# Patient Record
Sex: Female | Born: 1960 | Race: White | Hispanic: No | Marital: Married | Smoking: Current every day smoker
Health system: Southern US, Community
[De-identification: ages and names within clinical notes are randomized; demographics above are authoritative.]

## PROBLEM LIST (undated history)

## (undated) DIAGNOSIS — R2689 Other abnormalities of gait and mobility: Secondary | ICD-10-CM

## (undated) DIAGNOSIS — J439 Emphysema, unspecified: Secondary | ICD-10-CM

## (undated) DIAGNOSIS — I499 Cardiac arrhythmia, unspecified: Secondary | ICD-10-CM

## (undated) DIAGNOSIS — I10 Essential (primary) hypertension: Secondary | ICD-10-CM

## (undated) DIAGNOSIS — D126 Benign neoplasm of colon, unspecified: Secondary | ICD-10-CM

## (undated) DIAGNOSIS — R202 Paresthesia of skin: Secondary | ICD-10-CM

## (undated) DIAGNOSIS — F419 Anxiety disorder, unspecified: Secondary | ICD-10-CM

## (undated) DIAGNOSIS — D72829 Elevated white blood cell count, unspecified: Secondary | ICD-10-CM

## (undated) DIAGNOSIS — K5790 Diverticulosis of intestine, part unspecified, without perforation or abscess without bleeding: Secondary | ICD-10-CM

## (undated) DIAGNOSIS — F329 Major depressive disorder, single episode, unspecified: Secondary | ICD-10-CM

## (undated) DIAGNOSIS — E7800 Pure hypercholesterolemia, unspecified: Secondary | ICD-10-CM

## (undated) DIAGNOSIS — I219 Acute myocardial infarction, unspecified: Secondary | ICD-10-CM

## (undated) DIAGNOSIS — K219 Gastro-esophageal reflux disease without esophagitis: Secondary | ICD-10-CM

## (undated) DIAGNOSIS — K2210 Ulcer of esophagus without bleeding: Secondary | ICD-10-CM

## (undated) DIAGNOSIS — G43909 Migraine, unspecified, not intractable, without status migrainosus: Secondary | ICD-10-CM

## (undated) DIAGNOSIS — Z6834 Body mass index (BMI) 34.0-34.9, adult: Secondary | ICD-10-CM

## (undated) DIAGNOSIS — I700 Atherosclerosis of aorta: Secondary | ICD-10-CM

## (undated) HISTORY — DX: Body mass index (BMI) 34.0-34.9, adult: Z68.34

## (undated) HISTORY — DX: Other abnormalities of gait and mobility: R26.89

## (undated) HISTORY — DX: Diverticulosis of intestine, part unspecified, without perforation or abscess without bleeding: K57.90

## (undated) HISTORY — DX: Gastro-esophageal reflux disease without esophagitis: K21.9

## (undated) HISTORY — PX: KNEE ARTHROSCOPY W/ MENISCECTOMY: SHX1879

## (undated) HISTORY — DX: Emphysema, unspecified: J43.9

## (undated) HISTORY — PX: LUMBAR DISC SURGERY: SHX700

## (undated) HISTORY — DX: Elevated white blood cell count, unspecified: D72.829

## (undated) HISTORY — DX: Paresthesia of skin: R20.2

## (undated) HISTORY — DX: Anxiety disorder, unspecified: F41.9

## (undated) HISTORY — DX: Migraine, unspecified, not intractable, without status migrainosus: G43.909

## (undated) HISTORY — PX: UMBILICAL HERNIA REPAIR: SHX196

## (undated) HISTORY — DX: Cardiac arrhythmia, unspecified: I49.9

## (undated) HISTORY — DX: Benign neoplasm of colon, unspecified: D12.6

## (undated) HISTORY — DX: Atherosclerosis of aorta: I70.0

## (undated) HISTORY — DX: Major depressive disorder, single episode, unspecified: F32.9

## (undated) HISTORY — DX: Pure hypercholesterolemia, unspecified: E78.00

## (undated) HISTORY — DX: Essential (primary) hypertension: I10

## (undated) HISTORY — DX: Ulcer of esophagus without bleeding: K22.10

---
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
## 2003-06-02 DIAGNOSIS — I219 Acute myocardial infarction, unspecified: Secondary | ICD-10-CM

## 2003-06-02 HISTORY — PX: APPENDECTOMY: SHX54

## 2003-06-02 HISTORY — DX: Acute myocardial infarction, unspecified: I21.9

## 2011-06-02 HISTORY — PX: COLONOSCOPY: SHX174

## 2012-06-01 HISTORY — PX: GALLBLADDER SURGERY: SHX652

## 2017-08-10 ENCOUNTER — Other Ambulatory Visit: Payer: Self-pay | Admitting: Family

## 2017-08-10 ENCOUNTER — Ambulatory Visit
Admission: RE | Admit: 2017-08-10 | Discharge: 2017-08-10 | Disposition: A | Discharge status: Home / Self Care | Payer: No Typology Code available for payment source | Source: Ambulatory Visit | Attending: Family | Admitting: Family

## 2017-08-10 DIAGNOSIS — R109 Unspecified abdominal pain: Secondary | ICD-10-CM

## 2017-08-10 MED ORDER — IOPAMIDOL (ISOVUE-300) INJECTION 61%
100.0000 mL | Freq: Once | INTRAVENOUS | Status: AC | PRN
  Administered 2017-08-10: 100 mL via INTRAVENOUS

## 2018-12-21 ENCOUNTER — Encounter: Payer: Self-pay | Admitting: Physician Assistant

## 2019-01-10 ENCOUNTER — Other Ambulatory Visit (INDEPENDENT_AMBULATORY_CARE_PROVIDER_SITE_OTHER)

## 2019-01-10 ENCOUNTER — Encounter: Payer: Self-pay | Admitting: Physician Assistant

## 2019-01-10 ENCOUNTER — Encounter (INDEPENDENT_AMBULATORY_CARE_PROVIDER_SITE_OTHER): Payer: Self-pay

## 2019-01-10 ENCOUNTER — Ambulatory Visit (INDEPENDENT_AMBULATORY_CARE_PROVIDER_SITE_OTHER): Admitting: Physician Assistant

## 2019-01-10 VITALS — BP 130/88 | HR 64 | Temp 97.8°F | Ht 64.0 in | Wt 196.2 lb

## 2019-01-10 DIAGNOSIS — R110 Nausea: Secondary | ICD-10-CM

## 2019-01-10 DIAGNOSIS — R1031 Right lower quadrant pain: Secondary | ICD-10-CM | POA: Diagnosis not present

## 2019-01-10 DIAGNOSIS — R6881 Early satiety: Secondary | ICD-10-CM

## 2019-01-10 DIAGNOSIS — K5900 Constipation, unspecified: Secondary | ICD-10-CM

## 2019-01-10 DIAGNOSIS — G8929 Other chronic pain: Secondary | ICD-10-CM

## 2019-01-10 DIAGNOSIS — R079 Chest pain, unspecified: Secondary | ICD-10-CM

## 2019-01-10 LAB — BASIC METABOLIC PANEL
BUN: 8 mg/dL (ref 6–23)
CO2: 25 mEq/L (ref 19–32)
Calcium: 9.8 mg/dL (ref 8.4–10.5)
Chloride: 105 mEq/L (ref 96–112)
Creatinine, Ser: 0.81 mg/dL (ref 0.40–1.20)
GFR: 72.69 mL/min (ref >60.00)
Glucose, Bld: 104 mg/dL — ABNORMAL HIGH (ref 70–99)
Potassium: 4.5 mEq/L (ref 3.5–5.1)
Sodium: 139 mEq/L (ref 135–145)

## 2019-01-10 MED ORDER — PANTOPRAZOLE SODIUM 40 MG PO TBEC: 40.0000 mg | DELAYED_RELEASE_TABLET | Freq: Every day | ORAL | 6 refills | Status: DC

## 2019-01-10 NOTE — Patient Instructions (Signed)
You have been scheduled for a CT scan of the abdomen and pelvis at [facility] ([address]---this is in the same building as [facility]).   You are scheduled on 01/12/19 at 1045. You should arrive 15 minutes prior to your appointment time for registration. Please follow the written instructions below on the day of your exam:  WARNING: IF YOU ARE ALLERGIC TO IODINE/X-RAY DYE, PLEASE NOTIFY RADIOLOGY IMMEDIATELY AT [phone]! YOU WILL BE GIVEN A 13 HOUR PREMEDICATION PREP.  1) Do not eat or drink anything after 645am (4 hours prior to your test) 2) You have been given 2 bottles of oral contrast to drink. The solution may taste better if refrigerated, but do NOT add ice or any other liquid to this solution. Shake well before drinking.    Drink 1 bottle of contrast @ 845am (2 hours prior to your exam)  Drink 1 bottle of contrast @ 945am (1 hour prior to your exam)  You may take any medications as prescribed with a small amount of water, if necessary. If you take any of the following medications: METFORMIN, GLUCOPHAGE, GLUCOVANCE, AVANDAMET, RIOMET, FORTAMET, [place] MET, JANUMET, GLUMETZA or METAGLIP, you MAY be asked to HOLD this medication 48 hours AFTER the exam.  The purpose of you drinking the oral contrast is to aid in the visualization of your intestinal tract. The contrast solution may cause some diarrhea. Depending on your individual set of symptoms, you may also receive an intravenous injection of x-ray contrast/dye. Plan on being at [facility] for 30 minutes or longer, depending on the type of exam you are having performed.  This test typically takes 30-45 minutes to complete.  If you have any questions regarding your exam or if you need to reschedule, you may call the CT department at [phone] between the hours of 8:00 am and 5:00 pm, Monday-Friday.  We have sent the following medications to your pharmacy for you to pick up at your  convenience: Protonix Continue Phenergan 25 mg every 12 hours  Please purchase the following medications over the counter and take as directed: Miralax 1gm  In 8 oz of water daily   Increase water intake  Your provider has requested that you go to the basement level for lab work before leaving today. Press "B" on the elevator. The lab is located at the first door on the left as you exit the elevator.   ________________________________________________________________________

## 2019-01-10 NOTE — Progress Notes (Signed)
Subjective:    Patient ID: Leslie Roman, female    DOB: 1961/06/01, 58 y.o.   MRN: [id number]  HPI Leslie Roman is a pleasant 58 year old white female, new to GI today referred by Dr. Ivin Booty [facility] for evaluation of multiple GI complaints. Patient has history of hypertension, coronary artery disease, depression, unspecified cardiac arrhythmia, and is status post appendectomy,cholecystectomy and C-section.  She is that is post lumbar laminectomy. She reports having one prior colonoscopy done while living in [place] 5 to 6 years ago.  By her report this was done for screening and she had some small polyps but was told to follow-up in 10 years. Current GI symptoms have been ongoing over the past year with some progression.  She complains of right lower quadrant pain which is been fairly constant and present on a daily basis.  This is crampy in nature and occasionally sharp and stabbing, and is generally worse postprandially.  She tends to stay constipated and will go 2 to 3 days between bowel movements.  No regular laxative use.  She has noticed a small amount of bright red blood on the tissue and on occasion mixed with the stool.  Appetite has been decreased and she says she always feels full and bloated and has had some early satiety.  Weight overall has been stable. She describes constant nausea which is present all day long despite taking Phenergan 25 mg p.o. twice daily.  She has very occasional episode of vomiting.  She also describes constant sensation of fatigue.  She has been having intermittent chest pain which she says has become very frequent describes it as "like if I were having a heart attack".  When asked to be more specific she describes a bad pressure sensation.  This will occur at rest or with activity.  She does have cardiology consultation pending later this week.  She and her husband relate that she did have cardiac catheterization about 2  years ago which was apparently negative. She has been on Zantac 300 mg p.o. daily over the past year or so without any benefit.. CT of the abdomen and pelvis had been done March 2019 with finding of scattered diverticulosis the hepatic flexure and along the left colon and mild chronic loss of height at the vertebral bodies T9 and L3. Copies of labs from March 2020 through PCP with normal CBC and c-Met  Review of Systems Pertinent positive and negative review of systems were noted in the above HPI section.  All other review of systems was otherwise negative.  Outpatient Encounter Medications as of 01/10/2019  Medication Sig  . atorvastatin (LIPITOR) 40 MG tablet Take 40 mg by mouth daily.  Marland Kitchen lisinopril (ZESTRIL) 40 MG tablet Take 40 mg by mouth 2 (two) times daily.   . metoprolol succinate (TOPROL-XL) 25 MG 24 hr tablet Take 25 mg by mouth daily.  . promethazine (PHENERGAN) 25 MG tablet Take 25 mg by mouth every 12 (twelve) hours.   . ranitidine (ZANTAC) 300 MG capsule Take 300 mg by mouth daily.  . sertraline (ZOLOFT) 100 MG tablet Take 100 mg by mouth daily.  Marland Kitchen spironolactone (ALDACTONE) 25 MG tablet Take 25 mg by mouth daily.   . pantoprazole (PROTONIX) 40 MG tablet Take 1 tablet (40 mg total) by mouth daily.  . [DISCONTINUED] busPIRone (BUSPAR) 7.5 MG tablet Take 7.5 mg by mouth 3 (three) times daily.  . [DISCONTINUED] butalbital-acetaminophen-caffeine (FIORICET) 50-325-40 MG tablet Take by mouth.  . [DISCONTINUED]  ipratropium (ATROVENT HFA) 17 MCG/ACT inhaler Inhale 2 puffs into the lungs every 6 (six) hours.  . [DISCONTINUED] ipratropium-albuterol (DUONEB) 0.5-2.5 (3) MG/3ML SOLN Take 3 mLs by nebulization.  . [DISCONTINUED] ondansetron (ZOFRAN) 4 MG tablet Take 4 mg by mouth every 8 (eight) hours as needed for nausea or vomiting.  . [DISCONTINUED] phentermine 37.5 MG capsule Take 37.5 mg by mouth every morning.  . [DISCONTINUED] topiramate (TOPAMAX) 25 MG capsule Take 25 mg by mouth 2  (two) times daily.   No facility-administered encounter medications on file as of 01/10/2019.    Allergies  Allergen Reactions  . Imitrex [Sumatriptan] Itching   There are no active problems to display for this patient.  Social History   Socioeconomic History  . Marital status: Married    Spouse name: Not on file  . Number of children: Not on file  . Years of education: Not on file  . Highest education level: Not on file  Occupational History  . Not on file  Social Needs  . Financial resource strain: Not on file  . Food insecurity    Worry: Not on file    Inability: Not on file  . Transportation needs    Medical: Not on file    Non-medical: Not on file  Tobacco Use  . Smoking status: Current Every Day Smoker  . Smokeless tobacco: Never Used  Substance and Sexual Activity  . Alcohol use: Yes    Comment: seldom  . Drug use: Never  . Sexual activity: Not on file  Lifestyle  . Physical activity    Days per week: Not on file    Minutes per session: Not on file  . Stress: Not on file  Relationships  . Social Herbalist on phone: Not on file    Gets together: Not on file    Attends religious service: Not on file    Active member of club or organization: Not on file    Attends meetings of clubs or organizations: Not on file    Relationship status: Not on file  . Intimate partner violence    Fear of current or ex partner: Not on file    Emotionally abused: Not on file    Physically abused: Not on file    Forced sexual activity: Not on file  Other Topics Concern  . Not on file  Social History Narrative  . Not on file    Leslie Roman's family history includes Alzheimer's disease in her mother; Bipolar disorder in her sister; Diabetes in her father; Heart disease in her father and sister; Liver cancer in her paternal grandmother; Lung cancer in her father.      Objective:    Vitals:   01/10/19 0919  BP: 130/88  Pulse: 64  Temp: 97.8 F (36.6 C)     Physical Exam Well-developed well-nourished older white female in no acute distress.  Accompanied by her husband height, Weight, 196 pounds BMI 33.6  HEENT; nontraumatic normocephalic, EOMI, PE R LA, sclera anicteric. Oropharynx; not examined/wearing mass/COVID Neck; supple, no JVD Cardiovascular; regular rate and rhythm with S1-S2, no murmur rub or gallop Pulmonary; Clear bilaterally Abdomen; soft, she is tender in the right lower quadrant, no guarding, nondistended, no palpable mass or hepatosplenomegaly, bowel sounds are active Rectal; not done today Skin; benign exam, no jaundice rash or appreciable lesions Extremities; no clubbing cyanosis or edema skin warm and dry Neuro/Psych; alert and oriented x4, grossly nonfocal mood and affect appropriate  Assessment & Plan:   #27 58 year old white female with 1 year history of ongoing right lower quadrant pain fairly constant described as crampy and occasionally sharp.  This is been associated with constipation. #2 intermittent small-volume hematochezia-rule out secondary to hemorrhoids, versus occult lesion #3 chronic bloating fullness and early satiety as well as constant nausea.  Etiology not clear.  Patient is status post cholecystectomy.  Rule out peptic ulcer disease, chronic gastropathy, gastroparesis, versus functional dyspepsia  #4 fatigue #5.  Intermittent chest pain/pressure-possibly could be GERD related, need to rule out intermittent angina.  Cardiology consultation scheduled for later this week #6 hypertension &7.  Status post cholecystectomy appendectomy and C-section #8 unspecified cardiac arrhythmia-await cardiology consultation scheduled through [facility]; CBC with differential, be met, sed rate and CRP Schedule for CT of the abdomen and pelvis with contrast Stop ranitidine Protonix 40 mg p.o. every morning Add MiraLAX 17 g in 8 ounces of water every day and also advised patient to increase her water  intake. She will eventually need upper endoscopy and colonoscopy with Dr. Loletha Carrow.  Will await CT findings, and her upcoming cardiology appointment.  Patient was advised she needs to be cleared from a cardiac standpoint and undergo any necessary cardiac work-up prior to sedation for endoscopic evaluation.   Genia Harold PA-C 01/10/2019   Cc: Lind Guest, NP

## 2019-01-10 NOTE — Progress Notes (Signed)
____________________________________________________________  Attending physician addendum:  Thank you for sending this case to me. I have reviewed the entire note, and the outlined plan seems appropriate.  Henry Danis, MD  ____________________________________________________________  

## 2019-01-12 ENCOUNTER — Other Ambulatory Visit: Payer: Self-pay

## 2019-01-12 ENCOUNTER — Ambulatory Visit (INDEPENDENT_AMBULATORY_CARE_PROVIDER_SITE_OTHER)
Admission: RE | Admit: 2019-01-12 | Discharge: 2019-01-12 | Disposition: A | Discharge status: Home / Self Care | Source: Ambulatory Visit | Attending: Physician Assistant | Admitting: Physician Assistant

## 2019-01-12 DIAGNOSIS — G8929 Other chronic pain: Secondary | ICD-10-CM

## 2019-01-12 DIAGNOSIS — R079 Chest pain, unspecified: Secondary | ICD-10-CM

## 2019-01-12 DIAGNOSIS — R110 Nausea: Secondary | ICD-10-CM

## 2019-01-12 DIAGNOSIS — R1031 Right lower quadrant pain: Secondary | ICD-10-CM | POA: Diagnosis not present

## 2019-01-12 DIAGNOSIS — K5900 Constipation, unspecified: Secondary | ICD-10-CM

## 2019-01-12 DIAGNOSIS — R6881 Early satiety: Secondary | ICD-10-CM

## 2019-01-12 MED ORDER — IOHEXOL 300 MG/ML  SOLN
100.0000 mL | Freq: Once | INTRAMUSCULAR | Status: AC | PRN
  Administered 2019-01-12: 100 mL via INTRAVENOUS

## 2019-01-13 ENCOUNTER — Other Ambulatory Visit: Payer: Self-pay

## 2019-01-13 ENCOUNTER — Telehealth: Payer: Self-pay

## 2019-01-13 DIAGNOSIS — G8929 Other chronic pain: Secondary | ICD-10-CM

## 2019-01-13 DIAGNOSIS — R1031 Right lower quadrant pain: Secondary | ICD-10-CM

## 2019-01-13 NOTE — Telephone Encounter (Signed)
Patient informed of her results. Reports cardiologist told her she was okay for a colonoscopy. "I can go under." She will return for the lab work. States history of back issues and has seen neurosurgery for this in the past for her L4-5

## 2019-01-13 NOTE — Telephone Encounter (Signed)
-----  Message from Alfredia Ferguson, [place] sent at 01/12/2019 12:25 PM EDT ----- Please let pt and husband know the CT scan does not show any abnormality of Colon , other than diverticulosis- she may have some inflammation of proximal small bowel . 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  She was seeing a cardiologist this week - need to know how that went and if she needs further tests done ?  Also she was supposed to have CBC, ESR, CRP  When here - and only BMET was done - not sure why , but need the other labs done   Once I know about cardiac workup  Can decide about  Colonoscopy etc

## 2019-01-16 ENCOUNTER — Other Ambulatory Visit (INDEPENDENT_AMBULATORY_CARE_PROVIDER_SITE_OTHER)

## 2019-01-16 DIAGNOSIS — R1031 Right lower quadrant pain: Secondary | ICD-10-CM | POA: Diagnosis not present

## 2019-01-16 DIAGNOSIS — G8929 Other chronic pain: Secondary | ICD-10-CM

## 2019-01-16 LAB — CBC WITH DIFFERENTIAL/PLATELET
Basophils Absolute: 0.1 10*3/uL (ref 0.0–0.1)
Basophils Relative: 1.1 % (ref 0.0–3.0)
Eosinophils Absolute: 0.3 10*3/uL (ref 0.0–0.7)
Eosinophils Relative: 2.9 % (ref 0.0–5.0)
HCT: 45.9 % (ref 36.0–46.0)
Hemoglobin: 15.2 g/dL — ABNORMAL HIGH (ref 12.0–15.0)
Lymphocytes Relative: 19.7 % (ref 12.0–46.0)
Lymphs Abs: 1.7 10*3/uL (ref 0.7–4.0)
MCHC: 33.1 g/dL (ref 30.0–36.0)
MCV: 88.4 fl (ref 78.0–100.0)
Monocytes Absolute: 0.6 10*3/uL (ref 0.1–1.0)
Monocytes Relative: 6.6 % (ref 3.0–12.0)
Neutro Abs: 6.2 10*3/uL (ref 1.4–7.7)
Neutrophils Relative %: 69.7 % (ref 43.0–77.0)
Platelets: 214 10*3/uL (ref 150.0–400.0)
RBC: 5.2 Mil/uL — ABNORMAL HIGH (ref 3.87–5.11)
RDW: 13.1 % (ref 11.5–15.5)
WBC: 8.9 10*3/uL (ref 4.0–10.5)

## 2019-01-16 LAB — SEDIMENTATION RATE: Sed Rate: 19 mm/hr (ref 0–30)

## 2019-01-16 LAB — HIGH SENSITIVITY CRP: CRP, High Sensitivity: 3.44 mg/L (ref 0.000–5.000)

## 2019-01-16 NOTE — Telephone Encounter (Signed)
I reviewed the scan and do not see any abnormality in the proximal jejunum.  The area in question is under-distended with contrast.  Recommend EGD and colonoscopy with me in [facility]

## 2019-01-16 NOTE — Telephone Encounter (Signed)
Dr Loletha Carrow - please review my note and CT .Marland Kitchen pt has seen cardiologist , no further workup recommended- Initial plan was for EGD / and Colon. Do you want to do that first , or set her up for Colon / EGD and enteroscopy  At hospital to try to get to proximal jejunum  Where Ct said possible fold thickening /irregularity ?

## 2019-01-16 NOTE — Telephone Encounter (Signed)
Patient scheduled for EGD/Colon in [place] 01/24/19 at 1:30pm. Pre visit scheduled for 01/18/19 patient to arrive at 7:45am. Patient in agreement with the above

## 2019-01-17 DIAGNOSIS — I10 Essential (primary) hypertension: Secondary | ICD-10-CM | POA: Insufficient documentation

## 2019-01-17 DIAGNOSIS — R2689 Other abnormalities of gait and mobility: Secondary | ICD-10-CM | POA: Insufficient documentation

## 2019-01-17 DIAGNOSIS — K219 Gastro-esophageal reflux disease without esophagitis: Secondary | ICD-10-CM | POA: Insufficient documentation

## 2019-01-17 DIAGNOSIS — J439 Emphysema, unspecified: Secondary | ICD-10-CM | POA: Insufficient documentation

## 2019-01-17 DIAGNOSIS — I499 Cardiac arrhythmia, unspecified: Secondary | ICD-10-CM | POA: Insufficient documentation

## 2019-01-17 DIAGNOSIS — I219 Acute myocardial infarction, unspecified: Secondary | ICD-10-CM | POA: Insufficient documentation

## 2019-01-18 ENCOUNTER — Ambulatory Visit (AMBULATORY_SURGERY_CENTER): Payer: Self-pay | Admitting: *Deleted

## 2019-01-18 ENCOUNTER — Other Ambulatory Visit: Payer: Self-pay

## 2019-01-18 VITALS — Temp 97.1°F | Ht 64.0 in | Wt 198.0 lb

## 2019-01-18 DIAGNOSIS — R110 Nausea: Secondary | ICD-10-CM

## 2019-01-18 DIAGNOSIS — R6881 Early satiety: Secondary | ICD-10-CM

## 2019-01-18 DIAGNOSIS — G8929 Other chronic pain: Secondary | ICD-10-CM

## 2019-01-18 DIAGNOSIS — R1031 Right lower quadrant pain: Secondary | ICD-10-CM

## 2019-01-18 MED ORDER — NA SULFATE-K SULFATE-MG SULF 17.5-3.13-1.6 GM/177ML PO SOLN: ORAL | 0 refills | Status: DC

## 2019-01-18 NOTE — Progress Notes (Signed)
Patient in person for PV today. Patient denies any allergies to eggs or soy. Patient denies any problems with anesthesia/sedation. Patient denies any oxygen use at home. Patient denies taking any diet/weight loss medications or blood thinners. Pt is aware that care partner will wait in the car during procedure; if they feel like they will be too hot to wait in the car; they may wait in the lobby.  We want them to wear a mask (we do not have any that we can provide them), practice social distancing, and we will check their temperatures when they get here.  I did remind patient that their care partner needs to stay in the parking lot the entire time. Pt will wear mask into building. Suprep $15 off coupon given to pt.

## 2019-01-23 ENCOUNTER — Telehealth: Payer: Self-pay

## 2019-01-23 NOTE — Telephone Encounter (Signed)
Covid-19 screening questions   Do you now or have you had a fever in the last 14 days?  Do you have any respiratory symptoms of shortness of breath or cough now or in the last 14 days?  Do you have any family members or close contacts with diagnosed or suspected Covid-19 in the past 14 days?  Have you been tested for Covid-19 and found to be positive?      L/m to c/b.

## 2019-01-23 NOTE — Telephone Encounter (Signed)
Pt responded "no" to all screening questions °

## 2019-01-24 ENCOUNTER — Encounter: Payer: Self-pay | Admitting: Gastroenterology

## 2019-01-24 ENCOUNTER — Ambulatory Visit (AMBULATORY_SURGERY_CENTER): Admitting: Gastroenterology

## 2019-01-24 ENCOUNTER — Other Ambulatory Visit: Payer: Self-pay

## 2019-01-24 VITALS — BP 155/91 | HR 70 | Temp 98.1°F | Resp 16 | Ht 64.0 in | Wt 198.0 lb

## 2019-01-24 DIAGNOSIS — D122 Benign neoplasm of ascending colon: Secondary | ICD-10-CM

## 2019-01-24 DIAGNOSIS — K5730 Diverticulosis of large intestine without perforation or abscess without bleeding: Secondary | ICD-10-CM

## 2019-01-24 DIAGNOSIS — R110 Nausea: Secondary | ICD-10-CM

## 2019-01-24 DIAGNOSIS — K2210 Ulcer of esophagus without bleeding: Secondary | ICD-10-CM | POA: Diagnosis not present

## 2019-01-24 DIAGNOSIS — K635 Polyp of colon: Secondary | ICD-10-CM

## 2019-01-24 DIAGNOSIS — R1031 Right lower quadrant pain: Secondary | ICD-10-CM

## 2019-01-24 DIAGNOSIS — D120 Benign neoplasm of cecum: Secondary | ICD-10-CM

## 2019-01-24 MED ORDER — SODIUM CHLORIDE 0.9 % IV SOLN: 500.0000 mL | Freq: Once | INTRAVENOUS | Status: DC

## 2019-01-24 NOTE — Progress Notes (Signed)
Report given to PACU, vss 

## 2019-01-24 NOTE — Patient Instructions (Addendum)
Handouts Provided:  Polyps  YOU HAD AN ENDOSCOPIC PROCEDURE TODAY AT THE [facility]:   Refer to the procedure report that was given to you for any specific questions about what was found during the examination.  If the procedure report does not answer your questions, please call your gastroenterologist to clarify.  If you requested that your care partner not be given the details of your procedure findings, then the procedure report has been included in a sealed envelope for you to review at your convenience later.  YOU SHOULD EXPECT: Some feelings of bloating in the abdomen. Passage of more gas than usual.  Walking can help get rid of the air that was put into your GI tract during the procedure and reduce the bloating. If you had a lower endoscopy (such as a colonoscopy or flexible sigmoidoscopy) you may notice spotting of blood in your stool or on the toilet paper. If you underwent a bowel prep for your procedure, you may not have a normal bowel movement for a few days.  Please Note:  You might notice some irritation and congestion in your nose or some drainage.  This is from the oxygen used during your procedure.  There is no need for concern and it should clear up in a day or so.  SYMPTOMS TO REPORT IMMEDIATELY:   Following lower endoscopy (colonoscopy or flexible sigmoidoscopy):  Excessive amounts of blood in the stool  Significant tenderness or worsening of abdominal pains  Swelling of the abdomen that is new, acute  Fever of 100F or higher   Following upper endoscopy (EGD)  Vomiting of blood or coffee ground material  New chest pain or pain under the shoulder blades  Painful or persistently difficult swallowing  New shortness of breath  Fever of 100F or higher  Black, tarry-looking stools  For urgent or emergent issues, a gastroenterologist can be reached at any hour by calling [phone].   DIET:  We do recommend a small meal at first, but then you may proceed  to your regular diet.  Drink plenty of fluids but you should avoid alcoholic beverages for 24 hours.  ACTIVITY:  You should plan to take it easy for the rest of today and you should NOT DRIVE or use heavy machinery until tomorrow (because of the sedation medicines used during the test).    FOLLOW UP: Our staff will call the number listed on your records 48-72 hours following your procedure to check on you and address any questions or concerns that you may have regarding the information given to you following your procedure. If we do not reach you, we will leave a message.  We will attempt to reach you two times.  During this call, we will ask if you have developed any symptoms of COVID 19. If you develop any symptoms (ie: fever, flu-like symptoms, shortness of breath, cough etc.) before then, please call [phone].  If you test positive for Covid 19 in the 2 weeks post procedure, please call and report this information to us.    If any biopsies were taken you will be contacted by phone or by letter within the next 1-3 weeks.  Please call us at [phone] if you have not heard about the biopsies in 3 weeks.    SIGNATURES/CONFIDENTIALITY: You and/or your care partner have signed paperwork which will be entered into your electronic medical record.  These signatures attest to the fact that that the information above on your After Visit Summary   has been reviewed and is understood.  Full responsibility of the confidentiality of this discharge information lies with you and/or your care-partner.  

## 2019-01-24 NOTE — Op Note (Signed)
[facility] Patient Name: Leslie Roman Procedure Date: 01/24/2019 1:24 PM MRN: [id number] Endoscopist: Mallie Mussel L. Loletha Carrow , MD Age: 58 Referring MD:  Date of Birth: February 04, 1961 Gender: Female Account #: [id number] Procedure:                Colonoscopy Indications:              Abdominal pain in the right lower quadrant (approx.                            one year), Constipation (approx 6 months) - no                            cause seen on recent CTAP Medicines:                Monitored Anesthesia Care Procedure:                Pre-Anesthesia Assessment:                           - Prior to the procedure, a History and Physical                            was performed, and patient medications and                            allergies were reviewed. The patient's tolerance of                            previous anesthesia was also reviewed. The risks                            and benefits of the procedure and the sedation                            options and risks were discussed with the patient.                            All questions were answered, and informed consent                            was obtained. Prior Anticoagulants: The patient has                            taken no previous anticoagulant or antiplatelet                            agents. ASA Grade Assessment: III - A patient with                            severe systemic disease. After reviewing the risks                            and benefits, the patient was deemed in  satisfactory condition to undergo the procedure.                           After obtaining informed consent, the colonoscope                            was passed under direct vision. Throughout the                            procedure, the patient's blood pressure, pulse, and                            oxygen saturations were monitored continuously. The                            Colonoscope was introduced  through the anus and                            advanced to the the cecum, identified by                            appendiceal orifice and ileocecal valve ( the                            terminal ileum could not be intubated due to                            anatomy/scope looping). The colonoscopy was                            performed without difficulty. The patient tolerated                            the procedure well. The quality of the bowel                            preparation was good. The ileocecal valve,                            appendiceal orifice, and rectum were photographed. Scope In: 1:30:34 PM Scope Out: 1:55:33 PM Scope Withdrawal Time: 0 hours 19 minutes 45 seconds  Total Procedure Duration: 0 hours 24 minutes 59 seconds  Findings:                 The perianal and digital rectal examinations were                            normal.                           Multiple diverticula were found in the entire colon.                           A 8 mm polyp was found in the appendiceal orifice.  The polyp was sessile. The polyp was removed with a                            piecemeal technique using a cold snare. Resection                            and retrieval were complete.                           A 10 mm polyp was found in the proximal ascending                            colon. The polyp was sessile with a mucus cap. The                            polyp was removed with a hot snare. Resection and                            retrieval were complete.                           The exam was otherwise without abnormality on                            direct and retroflexion views. Complications:            No immediate complications. Estimated Blood Loss:     Estimated blood loss was minimal. Impression:               - Diverticulosis in the entire examined colon.                           - One 8 mm polyp at the appendiceal orifice,                             removed piecemeal using a cold snare. Resected and                            retrieved.                           - One 10 mm polyp in the proximal ascending colon,                            removed with a hot snare. Resected and retrieved.                           - The examination was otherwise normal on direct                            and retroflexion views. Recommendation:           - Patient has a contact number available for  emergencies. The signs and symptoms of potential                            delayed complications were discussed with the                            patient. Return to normal activities tomorrow.                            Written discharge instructions were provided to the                            patient.                           - Resume previous diet.                           - Continue present medications.                           - Await pathology results.                           - Repeat colonoscopy is recommended for                            surveillance. The colonoscopy date will be                            determined after pathology results from today's                            exam become available for review.                           - See the other procedure note for documentation of                            additional recommendations.                           - Miralax 1 capful (17 grams) in 8 ounces of water                            PO daily. Office follow up will be scheduled. Sherree Shankman L. Loletha Carrow, MD 01/24/2019 2:13:04 PM This report has been signed electronically.

## 2019-01-24 NOTE — Op Note (Signed)
[facility] Patient Name: Leslie Roman Procedure Date: 01/24/2019 1:24 PM MRN: [id number] Endoscopist: Mallie Mussel L. Loletha Carrow , MD Age: 58 Referring MD:  Date of Birth: December 26, 1960 Gender: Female Account #: [id number] Procedure:                Upper GI endoscopy Indications:              Nausea Medicines:                Monitored Anesthesia Care Procedure:                Pre-Anesthesia Assessment:                           - Prior to the procedure, a History and Physical                            was performed, and patient medications and                            allergies were reviewed. The patient's tolerance of                            previous anesthesia was also reviewed. The risks                            and benefits of the procedure and the sedation                            options and risks were discussed with the patient.                            All questions were answered, and informed consent                            was obtained. Prior Anticoagulants: The patient has                            taken no previous anticoagulant or antiplatelet                            agents. ASA Grade Assessment: III - A patient with                            severe systemic disease. After reviewing the risks                            and benefits, the patient was deemed in                            satisfactory condition to undergo the procedure.                           After obtaining informed consent, the endoscope was  passed under direct vision. Throughout the                            procedure, the patient's blood pressure, pulse, and                            oxygen saturations were monitored continuously. The                            Endoscope was introduced through the mouth, and                            advanced to the second part of duodenum. The upper                            GI endoscopy was accomplished without  difficulty.                            The patient tolerated the procedure well. Scope In: Scope Out: Findings:                 One small, clean-based, superficial esophageal                            ulcer was found at the gastroesophageal junction.                           The entire examined stomach was normal. Biopsies                            were taken with a cold forceps for histology.                            (Sidney protocol).                           The cardia and gastric fundus were normal on                            retroflexion.                           The examined duodenum was normal. Complications:            No immediate complications. Estimated Blood Loss:     Estimated blood loss: none. Estimated blood loss                            was minimal. Impression:               - Esophageal ulcer.                           - Normal stomach. Biopsied.                           - Normal examined duodenum. Recommendation:           -  Patient has a contact number available for                            emergencies. The signs and symptoms of potential                            delayed complications were discussed with the                            patient. Return to normal activities tomorrow.                            Written discharge instructions were provided to the                            patient.                           - Resume previous diet.                           - Continue present medications.                           - Await pathology results.                           - See the other procedure note for documentation of                            additional recommendations. Leslie Roman L. Loletha Carrow, MD 01/24/2019 2:16:09 PM This report has been signed electronically.

## 2019-01-26 ENCOUNTER — Telehealth: Payer: Self-pay

## 2019-01-26 NOTE — Telephone Encounter (Signed)
  Follow up Call-  Call back number 01/24/2019  Post procedure Call Back phone  # [phone]  Permission to leave phone message Yes     Patient questions:  Do you have a fever, pain , or abdominal swelling? No. Pain Score  0 *  Have you tolerated food without any problems? Yes.    Have you been able to return to your normal activities? Yes.    Do you have any questions about your discharge instructions: Diet   No. Medications  No. Follow up visit  No.  Do you have questions or concerns about your Care? No.  Actions: * If pain score is 4 or above: No action needed, pain <4. 1. Have you developed a fever since your procedure? no  2.   Have you had an respiratory symptoms (SOB or cough) since your procedure? no  3.   Have you tested positive for COVID 19 since your procedure no  4.   Have you had any family members/close contacts diagnosed with the COVID 19 since your procedure?  no   If yes to any of these questions please route to Joylene John, RN and Alphonsa Gin, Therapist, sports.

## 2019-02-02 ENCOUNTER — Encounter: Payer: Self-pay | Admitting: Gastroenterology

## 2019-02-20 ENCOUNTER — Ambulatory Visit: Admitting: Physician Assistant

## 2019-03-06 ENCOUNTER — Encounter: Payer: Self-pay | Admitting: *Deleted

## 2019-03-07 ENCOUNTER — Other Ambulatory Visit: Payer: Self-pay

## 2019-03-07 ENCOUNTER — Ambulatory Visit (INDEPENDENT_AMBULATORY_CARE_PROVIDER_SITE_OTHER): Admitting: Physician Assistant

## 2019-03-07 ENCOUNTER — Encounter: Payer: Self-pay | Admitting: Physician Assistant

## 2019-03-07 VITALS — BP 144/96 | HR 91 | Temp 97.6°F | Ht 64.0 in | Wt 199.0 lb

## 2019-03-07 DIAGNOSIS — D126 Benign neoplasm of colon, unspecified: Secondary | ICD-10-CM | POA: Diagnosis not present

## 2019-03-07 DIAGNOSIS — K5730 Diverticulosis of large intestine without perforation or abscess without bleeding: Secondary | ICD-10-CM | POA: Diagnosis not present

## 2019-03-07 DIAGNOSIS — K219 Gastro-esophageal reflux disease without esophagitis: Secondary | ICD-10-CM | POA: Diagnosis not present

## 2019-03-07 MED ORDER — PANTOPRAZOLE SODIUM 40 MG PO TBEC: 40.0000 mg | DELAYED_RELEASE_TABLET | Freq: Every day | ORAL | 11 refills | Status: AC

## 2019-03-07 NOTE — Progress Notes (Signed)
Subjective:    Patient ID: Leslie Roman, female    DOB: 07-18-1960, 58 y.o.   MRN: [id number]  HPI [name] is a pleasant 58 year old white female, recently established with Dr. Loletha Carrow and seen by myself.  She had presented with right lower quadrant abdominal pain, intermittent small-volume hematochezia, abdominal bloating and early satiety as well as intermittent nausea. CT of the abdomen and pelvis was done with contrast which showed a 4.3 cm periampullary duodenal diverticulum no inflammatory findings, there is some borderline possible mild fold irregularity proximal jejunum, interval 40% superior endplate compression fracture L1 old compressions at L3 and T9 degenerative disc disease causing left foraminal impingement at L4-5 and right foraminal impingement at L2-3 and L3-4. Patient subsequently had EGD and colonoscopy on 01/24/2019.  Colonoscopy revealed multiple diffuse diverticuli, she had an 8 mm and 10 mm polyp removed, one was an adenoma and the other a sessile serrated polyp.  At EGD noted to have 1 small ulcer at the GE junction otherwise negative exam.  Gastric biopsies were negative. She comes back in today for follow-up.  She says she has been having ongoing problems with back pain with radiation down into the left leg.  She is seen by a neurosurgeon and has been undergoing ongoing treatment, and injections.  Her main complaint today is persistent right lower quadrant discomfort which she describes as constant and nonradiating. She has been on pantoprazole 40 mg p.o. daily and has no complaints of nausea or bloating today.  Review of Systems Pertinent positive and negative review of systems were noted in the above HPI section.  All other review of systems was otherwise negative.  Outpatient Encounter Medications as of 03/07/2019  Medication Sig  . atorvastatin (LIPITOR) 40 MG tablet Take 40 mg by mouth daily.  Marland Kitchen gabapentin (NEURONTIN) 300 MG capsule Take 300 mg by mouth 3 (three) times  daily.  Marland Kitchen lisinopril (ZESTRIL) 40 MG tablet Take 40 mg by mouth 2 (two) times daily.   . metoprolol succinate (TOPROL-XL) 25 MG 24 hr tablet Take 25 mg by mouth daily.  . pantoprazole (PROTONIX) 40 MG tablet Take 1 tablet (40 mg total) by mouth daily.  . promethazine (PHENERGAN) 25 MG tablet Take 25 mg by mouth every 12 (twelve) hours.   . sertraline (ZOLOFT) 100 MG tablet Take 100 mg by mouth daily.  Marland Kitchen spironolactone (ALDACTONE) 25 MG tablet Take 25 mg by mouth daily.   . [DISCONTINUED] pantoprazole (PROTONIX) 40 MG tablet Take 1 tablet (40 mg total) by mouth daily.   No facility-administered encounter medications on file as of 03/07/2019.    Allergies  Allergen Reactions  . Imitrex [Sumatriptan] Itching   Patient Active Problem List   Diagnosis Date Noted  . Pulmonary emphysema ([place])   . MI (myocardial infarction) ([place])   . Irregular heartbeat   . HTN (hypertension)   . GERD (gastroesophageal reflux disease)   . Balance problem    Social History   Socioeconomic History  . Marital status: Married    Spouse name: Not on file  . Number of children: Not on file  . Years of education: Not on file  . Highest education level: Not on file  Occupational History  . Not on file  Social Needs  . Financial resource strain: Not on file  . Food insecurity    Worry: Not on file    Inability: Not on file  . Transportation needs    Medical: Not on file    Non-medical: Not  on file  Tobacco Use  . Smoking status: Current Every Day Smoker    Packs/day: 0.25    Types: Cigarettes  . Smokeless tobacco: Never Used  Substance and Sexual Activity  . Alcohol use: Not Currently    Comment: seldom  . Drug use: Never  . Sexual activity: Not on file  Lifestyle  . Physical activity    Days per week: Not on file    Minutes per session: Not on file  . Stress: Not on file  Relationships  . Social Herbalist on phone: Not on file    Gets together: Not on file    Attends religious  service: Not on file    Active member of club or organization: Not on file    Attends meetings of clubs or organizations: Not on file    Relationship status: Not on file  . Intimate partner violence    Fear of current or ex partner: Not on file    Emotionally abused: Not on file    Physically abused: Not on file    Forced sexual activity: Not on file  Other Topics Concern  . Not on file  Social History Narrative  . Not on file    Ms. Hollerbach's family history includes Alzheimer's disease in her mother; Bipolar disorder in her sister; Diabetes in her father; Heart disease in her father and sister; Liver cancer in her paternal grandmother; Lung cancer in her father.      Objective:    Vitals:   03/07/19 1500  BP: (!) 144/96  Pulse: 91  Temp: 97.6 F (36.4 C)    Physical Exam Well-developed well-nourished older white female in no acute distress.  Ambulating with a cane, accompanied by her husband height, Weight, 199 BMI 34.1  HEENT; nontraumatic normocephalic, EOMI, PER R LA, sclera anicteric. Oropharynx; not examined/mask/COVID Neck; supple, no JVD Cardiovascular; regular rate and rhythm with S1-S2, no murmur rub or gallop Pulmonary; Clear bilaterally Abdomen; soft, she has some fairly focal tenderness in the right groin/right suprapubic region, nondistended, no palpable mass or hepatosplenomegaly, bowel sounds are active Rectal; not done Skin; benign exam, no jaundice rash or appreciable lesions Extremities; no clubbing cyanosis or edema skin warm and dry Neuro/Psych; alert and oriented x4, grossly nonfocal mood and affect appropriate       Assessment & Plan:     #84  58 year old white female with right lower quadrant/right groin pain likely musculoskeletal in etiology, rule out radiculopathy with right foraminal impingement at L2-L3 and L3-4 on recent CT No intra-abdominal findings to explain her symptoms on CT or colonoscopy.  #2 diffuse diverticulosis #3  adenomatous and sessile serrated colon polyps-will be indicated for 3-year interval follow-up #4 chronic GERD, with small GE junction ulcer noted at recent EGD #5 coronary artery disease status post MI #6 history of pulmonary hypertension  Plan; we reviewed findings at EGD and colonoscopy.  Also discussed CT findings. She has follow-up with her neurosurgeon later this week. Continue pantoprazole 40 mg p.o. every morning, several refills were sent. Antireflux regimen, and educational materials regarding diverticulosis. Patient will follow-up with Dr. Loletha Carrow or myself on an as-needed basis.[place] Nurse Buckhorn PA-C 03/07/2019   Cc: Lind Guest, NP

## 2019-03-07 NOTE — Patient Instructions (Signed)
We have sent the following medications to your pharmacy for you to pick up at your convenience: Pantoprazole 40 mg every morning.  Follow your antireflux regimen which was given to you today.  Please decrease your Excedrin usage.  You will be due for a colonoscopy in 04/2022. We will send you a reminder in the mail when it gets closer to that time.  If you are age 58 or older, your body mass index should be between 23-30. Your Body mass index is 34.16 kg/m. If this is out of the aforementioned range listed, please consider follow up with your Primary Care Provider.  If you are age 58 or younger, your body mass index should be between 19-25. Your Body mass index is 34.16 kg/m. If this is out of the aformentioned range listed, please consider follow up with your Primary Care Provider.

## 2019-03-08 NOTE — Progress Notes (Signed)
____________________________________________________________  Attending physician addendum:  Thank you for sending this case to me. I have reviewed the entire note, and the outlined plan seems appropriate.  Henry Danis, MD  ____________________________________________________________  

## 2019-06-09 ENCOUNTER — Other Ambulatory Visit: Payer: Self-pay | Admitting: Neurological Surgery

## 2019-06-13 ENCOUNTER — Other Ambulatory Visit: Payer: Self-pay | Admitting: Neurological Surgery

## 2019-06-13 ENCOUNTER — Other Ambulatory Visit (HOSPITAL_COMMUNITY): Payer: Self-pay | Admitting: Neurological Surgery

## 2019-06-13 DIAGNOSIS — M5416 Radiculopathy, lumbar region: Secondary | ICD-10-CM

## 2019-06-21 ENCOUNTER — Ambulatory Visit (HOSPITAL_COMMUNITY)
Admission: RE | Admit: 2019-06-21 | Discharge: 2019-06-21 | Disposition: A | Discharge status: Home / Self Care | Source: Ambulatory Visit | Attending: Neurological Surgery | Admitting: Neurological Surgery

## 2019-06-21 ENCOUNTER — Other Ambulatory Visit: Payer: Self-pay

## 2019-06-21 DIAGNOSIS — M5416 Radiculopathy, lumbar region: Secondary | ICD-10-CM | POA: Diagnosis not present

## 2019-08-02 NOTE — Pre-Procedure Instructions (Signed)
Leslie Roman  08/02/2019      [facility] [address] - [place], [place] [address] Phone: [phone] Fax: [phone]    Your procedure is scheduled on Mar.8  Report to [facility] Entrance A at 6:00 A.M.  Call this number if you have problems the morning of surgery:  [phone]   Remember:  Do not eat or drink after midnight.      Take these medicines the morning of surgery with A SIP OF WATER :              Atorvastatin (lipitor)             Gabapentin (neurontin)             Metoprolol (toprol-xl)             Pantoprazole (protonix) if needed             Phenergan if needed             Sertraline (zoloft)          7 days prior to surgery STOP taking any Aspirin (unless otherwise instructed by your surgeon), Aleve, Naproxen, Ibuprofen, Motrin, Advil, Goody's, BC's, all herbal medications, fish oil, and all vitamins.                Do not wear jewelry, make-up or nail polish.  Do not wear lotions, powders, or perfumes, or deodorant.  Do not shave 48 hours prior to surgery.  Men may shave face and neck.  Do not bring valuables to the hospital.  [facility] is not responsible for any belongings or valuables.  Contacts, dentures or bridgework may not be worn into surgery.  Leave your suitcase in the car.  After surgery it may be brought to your room.  For patients admitted to the hospital, discharge time will be determined by your treatment team.  Patients discharged the day of surgery will not be allowed to drive home.    Special instructions:   [facility]- Preparing For Surgery  Before surgery, you can play an important role. Because skin is not sterile, your skin needs to be as free of germs as possible. You can reduce the number of germs on your skin by washing with CHG (chlorahexidine gluconate) Soap before surgery.  CHG is an antiseptic cleaner which kills germs and bonds with the skin to continue  killing germs even after washing.    Oral Hygiene is also important to reduce your risk of infection.  Remember - BRUSH YOUR TEETH THE MORNING OF SURGERY WITH YOUR REGULAR TOOTHPASTE  Please do not use if you have an allergy to CHG or antibacterial soaps. If your skin becomes reddened/irritated stop using the CHG.  Do not shave (including legs and underarms) for at least 48 hours prior to first CHG shower. It is OK to shave your face.  Please follow these instructions carefully.   1. Shower the NIGHT BEFORE SURGERY and the MORNING OF SURGERY with CHG.   2. If you chose to wash your hair, wash your hair first as usual with your normal shampoo.  3. After you shampoo, rinse your hair and body thoroughly to remove the shampoo.  4. Use CHG as you would any other liquid soap. You can apply CHG directly to the skin and wash gently with a scrungie or a clean washcloth.   5. Apply the CHG  Soap to your body ONLY FROM THE NECK DOWN.  Do not use on open wounds or open sores. Avoid contact with your eyes, ears, mouth and genitals (private parts). Wash Face and genitals (private parts)  with your normal soap.  6. Wash thoroughly, paying special attention to the area where your surgery will be performed.  7. Thoroughly rinse your body with warm water from the neck down.  8. DO NOT shower/wash with your normal soap after using and rinsing off the CHG Soap.  9. Pat yourself dry with a CLEAN TOWEL.  10. Wear CLEAN PAJAMAS to bed the night before surgery, wear comfortable clothes the morning of surgery  11. Place CLEAN SHEETS on your bed the night of your first shower and DO NOT SLEEP WITH PETS.    Day of Surgery:  Do not apply any deodorants/lotions.  Please wear clean clothes to the hospital/surgery center.   Remember to brush your teeth WITH YOUR REGULAR TOOTHPASTE.    Please read over the following fact sheets that you were given. Coughing and Deep Breathing and Surgical Site Infection  Prevention

## 2019-08-03 ENCOUNTER — Other Ambulatory Visit: Payer: Self-pay

## 2019-08-03 ENCOUNTER — Encounter (HOSPITAL_COMMUNITY): Payer: Self-pay

## 2019-08-03 ENCOUNTER — Encounter (HOSPITAL_COMMUNITY)
Admission: RE | Admit: 2019-08-03 | Discharge: 2019-08-03 | Disposition: A | Discharge status: Home / Self Care | Source: Ambulatory Visit | Attending: Neurological Surgery | Admitting: Neurological Surgery

## 2019-08-03 ENCOUNTER — Other Ambulatory Visit (HOSPITAL_COMMUNITY)
Admission: RE | Admit: 2019-08-03 | Discharge: 2019-08-03 | Disposition: A | Discharge status: Home / Self Care | Source: Ambulatory Visit | Attending: Neurological Surgery | Admitting: Neurological Surgery

## 2019-08-03 DIAGNOSIS — Z20822 Contact with and (suspected) exposure to covid-19: Secondary | ICD-10-CM | POA: Insufficient documentation

## 2019-08-03 DIAGNOSIS — Z01818 Encounter for other preprocedural examination: Secondary | ICD-10-CM | POA: Diagnosis not present

## 2019-08-03 LAB — BASIC METABOLIC PANEL
Anion gap: 10 (ref 5–15)
BUN: 8 mg/dL (ref 6–20)
CO2: 26 mmol/L (ref 22–32)
Calcium: 9.4 mg/dL (ref 8.9–10.3)
Chloride: 106 mmol/L (ref 98–111)
Creatinine, Ser: 0.91 mg/dL (ref 0.44–1.00)
GFR calc Af Amer: >60 mL/min (ref >60)
GFR calc non Af Amer: >60 mL/min (ref >60)
Glucose, Bld: 85 mg/dL (ref 70–99)
Potassium: 4.2 mmol/L (ref 3.5–5.1)
Sodium: 142 mmol/L (ref 135–145)

## 2019-08-03 LAB — ABO/RH: ABO/RH(D): O POS

## 2019-08-03 LAB — CBC
HCT: 48.6 % — ABNORMAL HIGH (ref 36.0–46.0)
Hemoglobin: 15.6 g/dL — ABNORMAL HIGH (ref 12.0–15.0)
MCH: 28.5 pg (ref 26.0–34.0)
MCHC: 32.1 g/dL (ref 30.0–36.0)
MCV: 88.8 fL (ref 80.0–100.0)
Platelets: 254 10*3/uL (ref 150–400)
RBC: 5.47 MIL/uL — ABNORMAL HIGH (ref 3.87–5.11)
RDW: 13.5 % (ref 11.5–15.5)
WBC: 11 10*3/uL — ABNORMAL HIGH (ref 4.0–10.5)
nRBC: 0 % (ref 0.0–0.2)

## 2019-08-03 LAB — SURGICAL PCR SCREEN
MRSA, PCR: NEGATIVE
Staphylococcus aureus: NEGATIVE

## 2019-08-03 LAB — TYPE AND SCREEN
ABO/RH(D): O POS
Antibody Screen: NEGATIVE

## 2019-08-03 MED ORDER — CHLORHEXIDINE GLUCONATE CLOTH 2 % EX PADS: 6.0000 | MEDICATED_PAD | Freq: Once | CUTANEOUS | Status: DC

## 2019-08-03 NOTE — Progress Notes (Signed)
PCP - Dr Derrel Nip   In [facility]      Medical clearance Cardiologist - na      Chest x-ray - na EKG - today Stress Test - na ECHO - na    Cardiac Cath - na Sleep Study - yes CPAP - no    a  Aspirin Instructions:stop    COVID TEST- for today   Anesthesia review: review ekg,htn  Patient denies shortness of breath, fever, cough and chest pain at PAT appointment   All instructions explained to the patient, with a verbal understanding of the material. Patient agrees to go over the instructions while at home for a better understanding. Patient also instructed to self quarantine after being tested for COVID-19. The opportunity to ask questions was provided.

## 2019-08-04 LAB — SARS CORONAVIRUS 2 (TAT 6-24 HRS): SARS Coronavirus 2: NEGATIVE

## 2019-08-06 NOTE — Anesthesia Preprocedure Evaluation (Addendum)
Anesthesia Evaluation  Patient identified by MRN, date of birth, ID band Patient awake    Reviewed: Allergy & Precautions, NPO status , Patient's Chart, lab work & pertinent test results  Airway Mallampati: I  TM Distance: >3 FB Neck ROM: Full    Dental no notable dental hx. (+) Edentulous Upper, Edentulous Lower   Pulmonary COPD, Current Smoker and Patient abstained from smoking.,    Pulmonary exam normal breath sounds clear to auscultation       Cardiovascular hypertension, Pt. on home beta blockers and Pt. on medications + Past MI  Normal cardiovascular exam Rhythm:Regular Rate:Normal  pulm HTN   Neuro/Psych  Headaches,    GI/Hepatic Neg liver ROS, PUD, GERD  Medicated,  Endo/Other  negative endocrine ROS  Renal/GU Renal InsufficiencyRenal diseaseK+ 4.2 Cr 0.91     Musculoskeletal   Abdominal (+) + obese,   Peds  Hematology Hgb 15.6 Plt 254   Anesthesia Other Findings   Reproductive/Obstetrics                           Anesthesia Physical Anesthesia Plan  ASA: III  Anesthesia Plan: General   Post-op Pain Management:    Induction: Intravenous  PONV Risk Score and Plan: 3 and Treatment may vary due to age or medical condition, Ondansetron and Dexamethasone  Airway Management Planned: Oral ETT and Video Laryngoscope Planned  Additional Equipment: Arterial line  Intra-op Plan:   Post-operative Plan:   Informed Consent: I have reviewed the patients History and Physical, chart, labs and discussed the procedure including the risks, benefits and alternatives for the proposed anesthesia with the patient or authorized representative who has indicated his/her understanding and acceptance.     Dental advisory given  Plan Discussed with:   Anesthesia Plan Comments: (GA plus 2 large IVs 18g)      Anesthesia Quick Evaluation

## 2019-08-07 ENCOUNTER — Inpatient Hospital Stay (HOSPITAL_COMMUNITY): Admitting: Certified Registered Nurse Anesthetist

## 2019-08-07 ENCOUNTER — Encounter (HOSPITAL_COMMUNITY)
Admission: RE | Disposition: A | Discharge status: Home / Self Care | Payer: Self-pay | Source: Home / Self Care | Attending: Neurological Surgery

## 2019-08-07 ENCOUNTER — Inpatient Hospital Stay (HOSPITAL_COMMUNITY)
Admission: RE | Admit: 2019-08-07 | Discharge: 2019-08-08 | DRG: 455 | Disposition: A | Discharge status: Home / Self Care | Attending: Neurological Surgery | Admitting: Neurological Surgery

## 2019-08-07 ENCOUNTER — Other Ambulatory Visit: Payer: Self-pay

## 2019-08-07 ENCOUNTER — Inpatient Hospital Stay (HOSPITAL_COMMUNITY)

## 2019-08-07 ENCOUNTER — Inpatient Hospital Stay (HOSPITAL_COMMUNITY): Admitting: Physician Assistant

## 2019-08-07 ENCOUNTER — Encounter (HOSPITAL_COMMUNITY): Payer: Self-pay | Admitting: Neurological Surgery

## 2019-08-07 DIAGNOSIS — F17210 Nicotine dependence, cigarettes, uncomplicated: Secondary | ICD-10-CM | POA: Diagnosis present

## 2019-08-07 DIAGNOSIS — E7800 Pure hypercholesterolemia, unspecified: Secondary | ICD-10-CM | POA: Diagnosis present

## 2019-08-07 DIAGNOSIS — Z20822 Contact with and (suspected) exposure to covid-19: Secondary | ICD-10-CM | POA: Diagnosis present

## 2019-08-07 DIAGNOSIS — M5416 Radiculopathy, lumbar region: Secondary | ICD-10-CM | POA: Diagnosis present

## 2019-08-07 DIAGNOSIS — K219 Gastro-esophageal reflux disease without esophagitis: Secondary | ICD-10-CM | POA: Diagnosis present

## 2019-08-07 DIAGNOSIS — F419 Anxiety disorder, unspecified: Secondary | ICD-10-CM | POA: Diagnosis present

## 2019-08-07 DIAGNOSIS — M48061 Spinal stenosis, lumbar region without neurogenic claudication: Secondary | ICD-10-CM | POA: Diagnosis present

## 2019-08-07 DIAGNOSIS — I10 Essential (primary) hypertension: Secondary | ICD-10-CM | POA: Diagnosis present

## 2019-08-07 DIAGNOSIS — M4316 Spondylolisthesis, lumbar region: Principal | ICD-10-CM | POA: Diagnosis present

## 2019-08-07 DIAGNOSIS — M545 Low back pain: Secondary | ICD-10-CM | POA: Diagnosis present

## 2019-08-07 DIAGNOSIS — I252 Old myocardial infarction: Secondary | ICD-10-CM | POA: Diagnosis not present

## 2019-08-07 DIAGNOSIS — Z419 Encounter for procedure for purposes other than remedying health state, unspecified: Secondary | ICD-10-CM

## 2019-08-07 HISTORY — PX: TRANSFORAMINAL LUMBAR INTERBODY FUSION W/ MIS 1 LEVEL: SHX6145

## 2019-08-07 SURGERY — MINIMALLY INVASIVE (MIS) TRANSFORAMINAL LUMBAR INTERBODY FUSION (TLIF) 1 LEVEL
Anesthesia: General | Site: Spine Lumbar | Laterality: Left

## 2019-08-07 MED ORDER — ONDANSETRON HCL 4 MG/2ML IJ SOLN
INTRAMUSCULAR | Status: AC
  Filled 2019-08-07: qty 2

## 2019-08-07 MED ORDER — THROMBIN 5000 UNITS EX SOLR
CUTANEOUS | Status: AC
  Filled 2019-08-07: qty 5000

## 2019-08-07 MED ORDER — PROPOFOL 10 MG/ML IV BOLUS
INTRAVENOUS | Status: AC
  Filled 2019-08-07: qty 40

## 2019-08-07 MED ORDER — HYDROMORPHONE HCL 1 MG/ML IJ SOLN
INTRAMUSCULAR | Status: AC
  Filled 2019-08-07: qty 1

## 2019-08-07 MED ORDER — PROPOFOL 10 MG/ML IV BOLUS
INTRAVENOUS | Status: DC | PRN
  Administered 2019-08-07: 80 mg via INTRAVENOUS

## 2019-08-07 MED ORDER — DOCUSATE SODIUM 100 MG PO CAPS
100.0000 mg | ORAL_CAPSULE | Freq: Two times a day (BID) | ORAL | Status: DC
  Administered 2019-08-07: 100 mg via ORAL
  Filled 2019-08-07: qty 1

## 2019-08-07 MED ORDER — EPHEDRINE 5 MG/ML INJ
INTRAVENOUS | Status: AC
  Filled 2019-08-07: qty 10

## 2019-08-07 MED ORDER — ACETAMINOPHEN 10 MG/ML IV SOLN
INTRAVENOUS | Status: AC
  Filled 2019-08-07: qty 100

## 2019-08-07 MED ORDER — GLYCOPYRROLATE PF 0.2 MG/ML IJ SOSY
PREFILLED_SYRINGE | INTRAMUSCULAR | Status: DC | PRN
  Administered 2019-08-07 (×2): .1 mg via INTRAVENOUS
  Administered 2019-08-07: .2 mg via INTRAVENOUS

## 2019-08-07 MED ORDER — MEPERIDINE HCL 25 MG/ML IJ SOLN: 6.2500 mg | INTRAMUSCULAR | Status: DC | PRN

## 2019-08-07 MED ORDER — OXYCODONE HCL 5 MG PO TABS: 5.0000 mg | ORAL_TABLET | ORAL | Status: DC | PRN

## 2019-08-07 MED ORDER — HYDROMORPHONE HCL 1 MG/ML IJ SOLN: 1.0000 mg | INTRAMUSCULAR | Status: DC | PRN

## 2019-08-07 MED ORDER — CYCLOBENZAPRINE HCL 10 MG PO TABS
10.0000 mg | ORAL_TABLET | Freq: Three times a day (TID) | ORAL | Status: DC | PRN
  Administered 2019-08-07: 10 mg via ORAL
  Filled 2019-08-07: qty 1

## 2019-08-07 MED ORDER — DEXAMETHASONE SODIUM PHOSPHATE 10 MG/ML IJ SOLN
INTRAMUSCULAR | Status: AC
  Filled 2019-08-07: qty 1

## 2019-08-07 MED ORDER — PHENYLEPHRINE 40 MCG/ML (10ML) SYRINGE FOR IV PUSH (FOR BLOOD PRESSURE SUPPORT)
PREFILLED_SYRINGE | INTRAVENOUS | Status: DC | PRN
  Administered 2019-08-07: 40 ug via INTRAVENOUS

## 2019-08-07 MED ORDER — MIDAZOLAM HCL 2 MG/2ML IJ SOLN
INTRAMUSCULAR | Status: DC | PRN
  Administered 2019-08-07: 2 mg via INTRAVENOUS

## 2019-08-07 MED ORDER — PROMETHAZINE HCL 25 MG/ML IJ SOLN: 6.2500 mg | INTRAMUSCULAR | Status: DC | PRN

## 2019-08-07 MED ORDER — ACETAMINOPHEN 325 MG PO TABS: 650.0000 mg | ORAL_TABLET | ORAL | Status: DC | PRN

## 2019-08-07 MED ORDER — GABAPENTIN 600 MG PO TABS
600.0000 mg | ORAL_TABLET | Freq: Two times a day (BID) | ORAL | Status: DC
  Administered 2019-08-07: 600 mg via ORAL
  Filled 2019-08-07: qty 1

## 2019-08-07 MED ORDER — FENTANYL CITRATE (PF) 250 MCG/5ML IJ SOLN
INTRAMUSCULAR | Status: AC
  Filled 2019-08-07: qty 5

## 2019-08-07 MED ORDER — SODIUM CHLORIDE 0.9 % IV SOLN: 250.0000 mL | INTRAVENOUS | Status: DC

## 2019-08-07 MED ORDER — LIDOCAINE-EPINEPHRINE 1 %-1:100000 IJ SOLN
INTRAMUSCULAR | Status: AC
  Filled 2019-08-07: qty 1

## 2019-08-07 MED ORDER — PANTOPRAZOLE SODIUM 40 MG PO TBEC: 40.0000 mg | DELAYED_RELEASE_TABLET | Freq: Every day | ORAL | Status: DC | PRN

## 2019-08-07 MED ORDER — ESMOLOL HCL 100 MG/10ML IV SOLN
INTRAVENOUS | Status: AC
  Filled 2019-08-07: qty 10

## 2019-08-07 MED ORDER — PHENYLEPHRINE 40 MCG/ML (10ML) SYRINGE FOR IV PUSH (FOR BLOOD PRESSURE SUPPORT)
PREFILLED_SYRINGE | INTRAVENOUS | Status: AC
  Filled 2019-08-07: qty 10

## 2019-08-07 MED ORDER — PHENYLEPHRINE HCL-NACL 10-0.9 MG/250ML-% IV SOLN
INTRAVENOUS | Status: DC | PRN
  Administered 2019-08-07: 20 ug/min via INTRAVENOUS

## 2019-08-07 MED ORDER — PHENOL 1.4 % MT LIQD: 1.0000 | OROMUCOSAL | Status: DC | PRN

## 2019-08-07 MED ORDER — ALBUTEROL SULFATE HFA 108 (90 BASE) MCG/ACT IN AERS
INHALATION_SPRAY | RESPIRATORY_TRACT | Status: DC | PRN
  Administered 2019-08-07: 2 via RESPIRATORY_TRACT

## 2019-08-07 MED ORDER — HYDROCODONE-ACETAMINOPHEN 7.5-325 MG PO TABS: 1.0000 | ORAL_TABLET | Freq: Once | ORAL | Status: DC | PRN

## 2019-08-07 MED ORDER — ONDANSETRON HCL 4 MG/2ML IJ SOLN: 4.0000 mg | Freq: Four times a day (QID) | INTRAMUSCULAR | Status: DC | PRN

## 2019-08-07 MED ORDER — PROMETHAZINE HCL 25 MG PO TABS: 25.0000 mg | ORAL_TABLET | Freq: Two times a day (BID) | ORAL | Status: DC | PRN

## 2019-08-07 MED ORDER — CEFAZOLIN SODIUM-DEXTROSE 2-4 GM/100ML-% IV SOLN
2.0000 g | INTRAVENOUS | Status: AC
  Administered 2019-08-07 (×2): 2 g via INTRAVENOUS
  Filled 2019-08-07: qty 100

## 2019-08-07 MED ORDER — ONDANSETRON HCL 4 MG/2ML IJ SOLN
INTRAMUSCULAR | Status: DC | PRN
  Administered 2019-08-07: 4 mg via INTRAVENOUS

## 2019-08-07 MED ORDER — ONDANSETRON HCL 4 MG PO TABS: 4.0000 mg | ORAL_TABLET | Freq: Four times a day (QID) | ORAL | Status: DC | PRN

## 2019-08-07 MED ORDER — DICLOFENAC SODIUM 1 % EX GEL
2.0000 g | Freq: Four times a day (QID) | CUTANEOUS | Status: DC | PRN
  Filled 2019-08-07: qty 100

## 2019-08-07 MED ORDER — ROCURONIUM BROMIDE 10 MG/ML (PF) SYRINGE
PREFILLED_SYRINGE | INTRAVENOUS | Status: AC
  Filled 2019-08-07: qty 20

## 2019-08-07 MED ORDER — LIDOCAINE 2% (20 MG/ML) 5 ML SYRINGE
INTRAMUSCULAR | Status: DC | PRN
  Administered 2019-08-07: 60 mg via INTRAVENOUS

## 2019-08-07 MED ORDER — ACETAMINOPHEN 10 MG/ML IV SOLN: 1000.0000 mg | Freq: Once | INTRAVENOUS | Status: DC | PRN

## 2019-08-07 MED ORDER — LIDOCAINE 2% (20 MG/ML) 5 ML SYRINGE
INTRAMUSCULAR | Status: AC
  Filled 2019-08-07: qty 5

## 2019-08-07 MED ORDER — ALBUTEROL SULFATE HFA 108 (90 BASE) MCG/ACT IN AERS
INHALATION_SPRAY | RESPIRATORY_TRACT | Status: AC
  Filled 2019-08-07: qty 6.7

## 2019-08-07 MED ORDER — HYDROMORPHONE HCL 1 MG/ML IJ SOLN
0.2500 mg | INTRAMUSCULAR | Status: DC | PRN
  Administered 2019-08-07 (×2): 0.5 mg via INTRAVENOUS

## 2019-08-07 MED ORDER — ROCURONIUM BROMIDE 10 MG/ML (PF) SYRINGE
PREFILLED_SYRINGE | INTRAVENOUS | Status: DC | PRN
  Administered 2019-08-07 (×3): 50 mg via INTRAVENOUS

## 2019-08-07 MED ORDER — LACTATED RINGERS IV SOLN: INTRAVENOUS | Status: DC

## 2019-08-07 MED ORDER — ACETAMINOPHEN 650 MG RE SUPP: 650.0000 mg | RECTAL | Status: DC | PRN

## 2019-08-07 MED ORDER — SUGAMMADEX SODIUM 200 MG/2ML IV SOLN
INTRAVENOUS | Status: DC | PRN
  Administered 2019-08-07: 200 mg via INTRAVENOUS

## 2019-08-07 MED ORDER — SPIRONOLACTONE 25 MG PO TABS
25.0000 mg | ORAL_TABLET | Freq: Every day | ORAL | Status: DC
  Filled 2019-08-07: qty 1

## 2019-08-07 MED ORDER — FENTANYL CITRATE (PF) 250 MCG/5ML IJ SOLN
INTRAMUSCULAR | Status: DC | PRN
  Administered 2019-08-07: 25 ug via INTRAVENOUS
  Administered 2019-08-07: 50 ug via INTRAVENOUS
  Administered 2019-08-07: 75 ug via INTRAVENOUS

## 2019-08-07 MED ORDER — LIDOCAINE-EPINEPHRINE 1 %-1:100000 IJ SOLN
INTRAMUSCULAR | Status: DC | PRN
  Administered 2019-08-07: 10 mL

## 2019-08-07 MED ORDER — EPHEDRINE SULFATE-NACL 50-0.9 MG/10ML-% IV SOSY
PREFILLED_SYRINGE | INTRAVENOUS | Status: DC | PRN
  Administered 2019-08-07: 5 mg via INTRAVENOUS

## 2019-08-07 MED ORDER — MENTHOL 3 MG MT LOZG: 1.0000 | LOZENGE | OROMUCOSAL | Status: DC | PRN

## 2019-08-07 MED ORDER — METOPROLOL SUCCINATE ER 25 MG PO TB24: 25.0000 mg | ORAL_TABLET | Freq: Every day | ORAL | Status: DC

## 2019-08-07 MED ORDER — MIDAZOLAM HCL 2 MG/2ML IJ SOLN
INTRAMUSCULAR | Status: AC
  Filled 2019-08-07: qty 2

## 2019-08-07 MED ORDER — OXYCODONE HCL 5 MG PO TABS
10.0000 mg | ORAL_TABLET | ORAL | Status: DC | PRN
  Administered 2019-08-07 – 2019-08-08 (×4): 10 mg via ORAL
  Filled 2019-08-07 (×4): qty 2

## 2019-08-07 MED ORDER — SODIUM CHLORIDE 0.9 % IV SOLN
INTRAVENOUS | Status: DC | PRN
  Administered 2019-08-07: 500 mL

## 2019-08-07 MED ORDER — SERTRALINE HCL 50 MG PO TABS
100.0000 mg | ORAL_TABLET | Freq: Every day | ORAL | Status: DC
  Administered 2019-08-07: 100 mg via ORAL
  Filled 2019-08-07: qty 2

## 2019-08-07 MED ORDER — LISINOPRIL 20 MG PO TABS
40.0000 mg | ORAL_TABLET | Freq: Two times a day (BID) | ORAL | Status: DC
  Administered 2019-08-07: 40 mg via ORAL
  Filled 2019-08-07: qty 2

## 2019-08-07 MED ORDER — THROMBIN 5000 UNITS EX SOLR
OROMUCOSAL | Status: DC | PRN
  Administered 2019-08-07: 5 mL via TOPICAL

## 2019-08-07 MED ORDER — 0.9 % SODIUM CHLORIDE (POUR BTL) OPTIME
TOPICAL | Status: DC | PRN
  Administered 2019-08-07: 1000 mL

## 2019-08-07 MED ORDER — SODIUM CHLORIDE 0.9% FLUSH: 3.0000 mL | INTRAVENOUS | Status: DC | PRN

## 2019-08-07 MED ORDER — DEXAMETHASONE SODIUM PHOSPHATE 10 MG/ML IJ SOLN
INTRAMUSCULAR | Status: DC | PRN
  Administered 2019-08-07: 10 mg via INTRAVENOUS

## 2019-08-07 MED ORDER — CEFAZOLIN SODIUM-DEXTROSE 2-4 GM/100ML-% IV SOLN
2.0000 g | Freq: Three times a day (TID) | INTRAVENOUS | Status: AC
  Administered 2019-08-07 – 2019-08-08 (×2): 2 g via INTRAVENOUS
  Filled 2019-08-07 (×2): qty 100

## 2019-08-07 MED ORDER — GLYCOPYRROLATE PF 0.2 MG/ML IJ SOSY
PREFILLED_SYRINGE | INTRAMUSCULAR | Status: AC
  Filled 2019-08-07: qty 2

## 2019-08-07 MED ORDER — CEFAZOLIN SODIUM 1 G IJ SOLR
INTRAMUSCULAR | Status: AC
  Filled 2019-08-07: qty 20

## 2019-08-07 MED ORDER — POLYETHYLENE GLYCOL 3350 17 G PO PACK: 17.0000 g | PACK | Freq: Every day | ORAL | Status: DC | PRN

## 2019-08-07 MED ORDER — SODIUM CHLORIDE 0.9% FLUSH
3.0000 mL | Freq: Two times a day (BID) | INTRAVENOUS | Status: DC
  Administered 2019-08-07: 3 mL via INTRAVENOUS

## 2019-08-07 MED ORDER — ATORVASTATIN CALCIUM 40 MG PO TABS: 40.0000 mg | ORAL_TABLET | Freq: Every day | ORAL | Status: DC

## 2019-08-07 SURGICAL SUPPLY — 77 items
BAG DECANTER FOR FLEXI CONT (MISCELLANEOUS) ×3 IMPLANT
BLADE CLIPPER SURG (BLADE) IMPLANT
BLADE SURG 11 STRL SS (BLADE) ×3 IMPLANT
BUR MATCHSTICK NEURO 3.0 LAGG (BURR) ×3 IMPLANT
BUR PRECISION FLUTE 5.0 (BURR) ×3 IMPLANT
CNTNR URN SCR LID CUP LEK RST (MISCELLANEOUS) ×1 IMPLANT
CONT SPEC 4OZ STRL OR WHT (MISCELLANEOUS) ×2
COVER BACK TABLE 60X90IN (DRAPES) ×3 IMPLANT
COVER WAND RF STERILE (DRAPES) IMPLANT
DECANTER SPIKE VIAL GLASS SM (MISCELLANEOUS) ×3 IMPLANT
DERMABOND ADVANCED (GAUZE/BANDAGES/DRESSINGS) ×2
DERMABOND ADVANCED .7 DNX12 (GAUZE/BANDAGES/DRESSINGS) ×1 IMPLANT
DEVICE INTERBODY ELEVATE 23X8 (Cage) ×2 IMPLANT
DRAPE C-ARM 42X72 X-RAY (DRAPES) ×3 IMPLANT
DRAPE LAPAROTOMY 100X72X124 (DRAPES) ×3 IMPLANT
DRAPE MICROSCOPE LEICA (MISCELLANEOUS) ×3 IMPLANT
DRAPE SURG 17X23 STRL (DRAPES) IMPLANT
DRSG OPSITE POSTOP 4X6 (GAUZE/BANDAGES/DRESSINGS) ×3 IMPLANT
ELECT BLADE 6.5 EXT (BLADE) ×3 IMPLANT
ELECT REM PT RETURN 9FT ADLT (ELECTROSURGICAL) ×3
ELECTRODE REM PT RTRN 9FT ADLT (ELECTROSURGICAL) ×1 IMPLANT
EXTENDER TAB GUIDE SV 5.5/6.0 (INSTRUMENTS) ×24 IMPLANT
GAUZE 4X4 16PLY RFD (DISPOSABLE) ×3 IMPLANT
GAUZE SPONGE 4X4 12PLY STRL (GAUZE/BANDAGES/DRESSINGS) IMPLANT
GLOVE BIO SURGEON STRL SZ 6.5 (GLOVE) ×10 IMPLANT
GLOVE BIO SURGEON STRL SZ7 (GLOVE) ×6 IMPLANT
GLOVE BIO SURGEON STRL SZ7.5 (GLOVE) ×3 IMPLANT
GLOVE BIO SURGEON STRL SZ8 (GLOVE) ×3 IMPLANT
GLOVE BIO SURGEONS STRL SZ 6.5 (GLOVE) ×5
GLOVE BIOGEL PI IND STRL 6.5 (GLOVE) ×2 IMPLANT
GLOVE BIOGEL PI IND STRL 7.5 (GLOVE) ×2 IMPLANT
GLOVE BIOGEL PI IND STRL 8.5 (GLOVE) ×1 IMPLANT
GLOVE BIOGEL PI INDICATOR 6.5 (GLOVE) ×4
GLOVE BIOGEL PI INDICATOR 7.5 (GLOVE) ×4
GLOVE BIOGEL PI INDICATOR 8.5 (GLOVE) ×2
GLOVE ECLIPSE 7.5 STRL STRAW (GLOVE) ×6 IMPLANT
GLOVE EXAM NITRILE LRG STRL (GLOVE) IMPLANT
GLOVE EXAM NITRILE XL STR (GLOVE) IMPLANT
GLOVE EXAM NITRILE XS STR PU (GLOVE) IMPLANT
GOWN STRL REUS W/ TWL LRG LVL3 (GOWN DISPOSABLE) ×3 IMPLANT
GOWN STRL REUS W/ TWL XL LVL3 (GOWN DISPOSABLE) ×2 IMPLANT
GOWN STRL REUS W/TWL 2XL LVL3 (GOWN DISPOSABLE) IMPLANT
GOWN STRL REUS W/TWL LRG LVL3 (GOWN DISPOSABLE) ×6
GOWN STRL REUS W/TWL XL LVL3 (GOWN DISPOSABLE) ×4
GUIDEWIRE BLUNT NT 450 (WIRE) ×12 IMPLANT
HEMOSTAT POWDER KIT SURGIFOAM (HEMOSTASIS) ×3 IMPLANT
KIT BASIN OR (CUSTOM PROCEDURE TRAY) ×3 IMPLANT
KIT POSITION SURG JACKSON T1 (MISCELLANEOUS) ×3 IMPLANT
KIT TURNOVER KIT B (KITS) ×3 IMPLANT
NEEDLE BEVEL TWO-PAK W/1PK (NEEDLE) ×3 IMPLANT
NEEDLE HYPO 18GX1.5 BLUNT FILL (NEEDLE) IMPLANT
NEEDLE HYPO 22GX1.5 SAFETY (NEEDLE) ×3 IMPLANT
NEEDLE SPNL 18GX3.5 QUINCKE PK (NEEDLE) ×3 IMPLANT
NS IRRIG 1000ML POUR BTL (IV SOLUTION) ×3 IMPLANT
PACK LAMINECTOMY NEURO (CUSTOM PROCEDURE TRAY) ×3 IMPLANT
PAD ARMBOARD 7.5X6 YLW CONV (MISCELLANEOUS) ×9 IMPLANT
PUTTY DBM GRAFTON 5CC (Putty) ×3 IMPLANT
ROD 5.5 CCM PERC 40 (Rod) ×3 IMPLANT
ROD 5.5X45MM SOLERA VOYAGER (Rod) ×3 IMPLANT
RUBBERBAND STERILE (MISCELLANEOUS) ×6 IMPLANT
SCREW 6.5X35 VOYAGER MAS FNS (Screw) ×12 IMPLANT
SCREW FENS MAS CCM 7.5X40 (Screw) ×3 IMPLANT
SCREW SET 5.5/6.0MM SOLERA (Screw) ×12 IMPLANT
SPACER SPNL XLORDOTIC 23X8X (Cage) ×1 IMPLANT
SPCR SPNL XLORDOTIC 23X8X (Cage) ×1 IMPLANT
SPONGE LAP 4X18 RFD (DISPOSABLE) IMPLANT
STAPLER VISISTAT 35W (STAPLE) ×3 IMPLANT
SUT MNCRL AB 3-0 PS2 18 (SUTURE) ×3 IMPLANT
SUT VIC AB 0 CT1 18XCR BRD8 (SUTURE) IMPLANT
SUT VIC AB 0 CT1 8-18 (SUTURE)
SUT VIC AB 2-0 CP2 18 (SUTURE) ×6 IMPLANT
SYR 3ML LL SCALE MARK (SYRINGE) IMPLANT
TOWEL GREEN STERILE (TOWEL DISPOSABLE) ×3 IMPLANT
TOWEL GREEN STERILE FF (TOWEL DISPOSABLE) ×3 IMPLANT
TRAY FOLEY MTR SLVR 16FR STAT (SET/KITS/TRAYS/PACK) IMPLANT
TRAY FOLEY W/BAG SLVR 14FR (SET/KITS/TRAYS/PACK) ×3 IMPLANT
WATER STERILE IRR 1000ML POUR (IV SOLUTION) ×3 IMPLANT

## 2019-08-07 NOTE — Brief Op Note (Signed)
08/07/2019  1:16 PM  PATIENT:  Leslie Roman  59 y.o. female  PRE-OPERATIVE DIAGNOSIS:  Radiculopathy, Lumbar region  POST-OPERATIVE DIAGNOSIS:  Radiculopathy, Lumbar Region  PROCEDURE:  Procedure(s) with comments: Left Lumbar Four-Fiveminimally invasive transforaminal lumbar interbody fusion. (Left) - posterior  SURGEON:  Surgeon(s) and Role:    * Karliah Kowalchuk, Joyice Faster, MD - Primary    * Kary Kos, MD - Assisting  PHYSICIAN ASSISTANT:   ANESTHESIA:   general  EBL:  100cc  BLOOD ADMINISTERED:none  DRAINS: none   LOCAL MEDICATIONS USED:  LIDOCAINE   SPECIMEN:  No Specimen  DISPOSITION OF SPECIMEN:  N/A  COUNTS:  YES  TOURNIQUET:  * No tourniquets in log *  DICTATION: .Note written in EPIC  PLAN OF CARE: Admit to inpatient   PATIENT DISPOSITION:  PACU - hemodynamically stable.   Delay start of Pharmacological VTE agent (>24hrs) due to surgical blood loss or risk of bleeding: yes

## 2019-08-07 NOTE — Anesthesia Postprocedure Evaluation (Signed)
Anesthesia Post Note  Patient: Leslie Roman  Procedure(s) Performed: Left Lumbar Four-Fiveminimally invasive transforaminal lumbar interbody fusion. (Left Spine Lumbar)     Patient location during evaluation: PACU Anesthesia Type: General Level of consciousness: awake and alert Pain management: pain level controlled Vital Signs Assessment: post-procedure vital signs reviewed and stable Respiratory status: spontaneous breathing, nonlabored ventilation, respiratory function stable and patient connected to nasal cannula oxygen Cardiovascular status: blood pressure returned to baseline and stable Postop Assessment: no apparent nausea or vomiting Anesthetic complications: no    Last Vitals:  Vitals:   08/07/19 1441 08/07/19 1505  BP: 117/81 114/73  Pulse: 64 71  Resp: 11 18  Temp:  36.9 C  SpO2: 95% 97%    Last Pain:  Vitals:   08/07/19 1505  TempSrc: Oral  PainSc:                  Barnet Glasgow

## 2019-08-07 NOTE — Op Note (Signed)
PATIENT: Leslie Roman  DAY OF SURGERY: 08/07/19   PRE-OPERATIVE DIAGNOSIS:  Lumbar radiculopathy   POST-OPERATIVE DIAGNOSIS:  Lumbar radiculopathy   PROCEDURE:  L4-L5 minimally invasive left complete facetectomy, transforaminal lumbar interbody fusion with bilateral L4-L5 pedicle screw placement   SURGEON:  Surgeon(s) and Role:    Judith Part, MD - Primary    Kary Kos, MD - Assisting   ANESTHESIA: ETGA   BRIEF HISTORY: This is a 59 year old woman who presented with left lower extremity radicular pain. I originally treated her one year ago with a left MIS discectomy for radiculopathy. She did well post-op, but her symptoms have recurred in both the L4 and L5 distributions. Her repeat imaging showed progression of her spondylolisthesis, facet arthropathy, and foraminal stenosis.  This was discussed with the patient as well as risks, benefits, and alternatives and wished to proceed with surgery.   OPERATIVE DETAIL:  A formal time out was performed with two patient identifiers and confirmed the operative site. Anesthesia was induced by the anesthesia team. The patient was placed on the OR table in the prone position. The operative site was marked, hair was clipped with surgical clippers, the area was then prepped and draped in a sterile fashion.   Using fluoroscopy, the L4 and L5 pedicles were marked. On the left side, the incision was created between these two points and a [employer] tube was docked onto the left L4-5 facet complex. The microscope was draped in a sterile fashion and brought into the field.   As expected, there was significant scarring from her prior surgery that made the dissection more difficult. A left L4-5 facetectomy was performed and the left L4 nerve root was decompressed along its entire course. The tube was wanded medially and the traversing nerve root was identified and decompressed along its course.   The TLIF was then performed. Dr. Saintclair Halsted scrubbed in to assist  across me for the discectomy and to provide safe retraction during the graft placement. The tube was redirected and the disc space was identified, incised, and a discectomy was performed in the standard fashion. The endplates were prepped, bone graft was packed into the disc space, and an expandable cage (Medtronic) was packed with autograft and placed into the disc space with fluoroscopic confirmation. The remainder of the disc space was packed with autograft and cortical allograft. The tube was then removed and hemostasis was obtained during its removal.   Attention was then turned to pedicle instrumentation (Medtronic hardware). Bilaterally at L4 and L5, using fluoroscopy, a Jamshidi needle was advanced along the pedicle. A K-wire was then inserted and used to guide a tap and then screw with tower. On the left at L4, the pedicle was cannulated without difficulty, but the bone was notably soft without good purchase. As the screw was tightened down, it appeared to fracture the inferior aspect of the pedicle and begin encroaching on the foramen. I therefore removed that screw and re-cannulated the pedicle more superiorly. Instead of the planned 6.93mm screw, a 7.74mm screw was inserted for better purchase. A rod was sized and introduced on both sides, confirmed with fluoroscopy, then final tightened. On the left, while walking the set screw down onto the rod, it began to pull the screw back some. With enough forward pressure, I was able to secure and lock that cap with the screw still in place.   Hemostasis was again confirmed for both incisions, they were copiously irrigated, and then closed in layers.  EBL:  14mL   DRAINS: none   SPECIMENS: none   Judith Part, MD 08/07/19 7:17 AM

## 2019-08-07 NOTE — H&P (Signed)
Surgical H&P Update  HPI: 59 y.o. woman with left lower extremity radicular pain, here for surgical treatment. Symptoms consist of pain radiating down the left leg into the L4 and L5 distributions. 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 Her Sx unfortunately were not resolved with non-surgical treatment measures. No changes in health since she was last seen. Still having symptoms and wishes to proceed with surgery.  PMHx:  Past Medical History:  Diagnosis Date  . Adenomatous colon polyp   . Anxiety   . Aortic atherosclerosis ([place])   . Balance problem   . Body mass index (BMI) of 34.0 to 34.9 in adult   . Diverticulosis   . Esophageal ulcer   . GERD (gastroesophageal reflux disease)   . HTN (hypertension)   . Hypercholesterolemia   . Irregular heartbeat   . Leucocytosis   . Major depressive disorder, single episode, unspecified   . MI (myocardial infarction) ([place]) 2005  . Migraine   . Pulmonary emphysema ([place])   . Tingling in extremities    left upper extremity   FamHx:  Family History  Problem Relation Age of Onset  . Alzheimer's disease Mother   . Lung cancer Father   . Diabetes Father   . Heart disease Father   . Bipolar disorder Sister   . Liver cancer Paternal Grandmother   . Heart disease Sister   . Colon cancer Neg Hx   . Esophageal cancer Neg Hx   . Pancreatic cancer Neg Hx   . Stomach cancer Neg Hx   . Colon polyps Neg Hx   . Rectal cancer Neg Hx    SocHx:  reports that she has been smoking cigarettes. She has a 6.25 pack-year smoking history. She has never used smokeless tobacco. She reports previous alcohol use. She reports that she does not use drugs.  Physical Exam: AOx3, PERRL, FS, TM  Strength 5/5 x4, SILTx4  Assesment/Plan: 59 y.o. woman with LLE lumbar radiculopathy, here for L4-5 MIS TLIF. Risks, benefits, and alternatives discussed and the patient would like to continue with surgery.  -OR today -3C  post-op  Judith Part, MD 08/07/19 7:06 AM

## 2019-08-07 NOTE — Anesthesia Procedure Notes (Signed)
Arterial Line Insertion Performed by: Valda Favia, CRNA, CRNA  Patient location: OR. Preanesthetic checklist: patient identified, IV checked, site marked, risks and benefits discussed, surgical consent, monitors and equipment checked, pre-op evaluation, timeout performed and anesthesia consent Left, radial was placed Catheter size: 20 G Hand hygiene performed , maximum sterile barriers used  and Seldinger technique used Allen's test indicative of satisfactory collateral circulation Attempts: 1 Procedure performed without using ultrasound guided technique. Following insertion, dressing applied and Biopatch. Post procedure assessment: normal  Patient tolerated the procedure well with no immediate complications. Additional procedure comments: GETA.

## 2019-08-07 NOTE — Progress Notes (Signed)
Neurosurgery Service Post-operative progress note  Assessment & Plan: 59 y.o. woman s/p L4-5 MIS TLIF, seen in PACU, MAEx4 equally w/ intact sensation, recovering well.   -admit to [facility] -activity / diet as tolerated  Marcello Moores A Dwan Fennel  08/07/19 1:28 PM

## 2019-08-07 NOTE — Anesthesia Procedure Notes (Signed)
Procedure Name: Intubation Date/Time: 08/07/2019 8:10 AM Performed by: Valda Favia, CRNA Pre-anesthesia Checklist: Patient identified, Emergency Drugs available, Suction available and Patient being monitored Patient Re-evaluated:Patient Re-evaluated prior to induction Oxygen Delivery Method: Circle System Utilized Preoxygenation: Pre-oxygenation with 100% oxygen Induction Type: IV induction Ventilation: Mask ventilation without difficulty Laryngoscope Size: Mac and 4 Grade View: Grade I Tube type: Oral Tube size: 7.0 mm Number of attempts: 1 Airway Equipment and Method: Stylet and Oral airway Placement Confirmation: ETT inserted through vocal cords under direct vision,  positive ETCO2 and breath sounds checked- equal and bilateral Secured at: 21 cm Tube secured with: Tape Dental Injury: Teeth and Oropharynx as per pre-operative assessment

## 2019-08-07 NOTE — Transfer of Care (Signed)
Immediate Anesthesia Transfer of Care Note  Patient: Leslie Roman  Procedure(s) Performed: Left Lumbar Four-Fiveminimally invasive transforaminal lumbar interbody fusion. (Left Spine Lumbar)  Patient Location: PACU  Anesthesia Type:General  Level of Consciousness: drowsy  Airway & Oxygen Therapy: Patient Spontanous Breathing and Patient connected to face mask oxygen  Post-op Assessment: Report given to RN and Post -op Vital signs reviewed and stable  Post vital signs: Reviewed and stable  Last Vitals:  Vitals Value Taken Time  BP 159/90 08/07/19 1330  Temp 36.5 C 08/07/19 1330  Pulse 87 08/07/19 1332  Resp 17 08/07/19 1332  SpO2 100 % 08/07/19 1332  Vitals shown include unvalidated device data.  Last Pain:  Vitals:   08/07/19 1330  TempSrc:   PainSc: Asleep         Complications: No apparent anesthesia complications

## 2019-08-08 ENCOUNTER — Encounter: Payer: Self-pay | Admitting: *Deleted

## 2019-08-08 MED ORDER — OXYCODONE HCL 5 MG PO TABS: 5.0000 mg | ORAL_TABLET | ORAL | 0 refills | Status: AC | PRN

## 2019-08-08 NOTE — Progress Notes (Signed)
Neurosurgery Service Progress Note  Subjective: No acute events overnight, leg pain resolved post-op, no weakness/numbness/paresthesias  Objective: Vitals:   08/07/19 1505 08/07/19 2000 08/07/19 2331 08/08/19 0356  BP: 114/73 130/85 111/86 120/73  Pulse: 71 71 72 70  Resp: 18 18 18 20   Temp: 98.5 F (36.9 C) (!) 97.4 F (36.3 C) 98.8 F (37.1 C) 98.7 F (37.1 C)  TempSrc: Oral Oral Oral Oral  SpO2: 97% 95% 94% 93%  Weight:      Height:       Temp (24hrs), Avg:98.1 F (36.7 C), Min:97.4 F (36.3 C), Max:98.8 F (37.1 C)  CBC Latest Ref Rng & Units 08/03/2019 01/16/2019  WBC 4.0 - 10.5 K/uL 11.0(H) 8.9  Hemoglobin 12.0 - 15.0 g/dL 15.6(H) 15.2(H)  Hematocrit 36.0 - 46.0 % 48.6(H) 45.9  Platelets 150 - 400 K/uL 254 214.0   BMP Latest Ref Rng & Units 08/03/2019 01/10/2019  Glucose 70 - 99 mg/dL 85 104(H)  BUN 6 - 20 mg/dL 8 8  Creatinine 0.44 - 1.00 mg/dL 0.91 0.81  Sodium 135 - 145 mmol/L 142 139  Potassium 3.5 - 5.1 mmol/L 4.2 4.5  Chloride 98 - 111 mmol/L 106 105  CO2 22 - 32 mmol/L 26 25  Calcium 8.9 - 10.3 mg/dL 9.4 9.8    Intake/Output Summary (Last 24 hours) at 08/08/2019 0717 Last data filed at 08/07/2019 2148 Gross per 24 hour  Intake 2783 ml  Output 1050 ml  Net 1733 ml    Current Facility-Administered Medications:  .  0.9 %  sodium chloride infusion, 250 mL, Intravenous, Continuous, Fredrich Cory A, MD .  acetaminophen (TYLENOL) tablet 650 mg, 650 mg, Oral, Q4H PRN **OR** acetaminophen (TYLENOL) suppository 650 mg, 650 mg, Rectal, Q4H PRN, Judith Part, MD .  atorvastatin (LIPITOR) tablet 40 mg, 40 mg, Oral, Daily, Shakti Fleer A, MD .  cyclobenzaprine (FLEXERIL) tablet 10 mg, 10 mg, Oral, TID PRN, Judith Part, MD, 10 mg at 08/07/19 1635 .  diclofenac Sodium (VOLTAREN) 1 % topical gel 2 g, 2 g, Topical, QID PRN, Judith Part, MD .  docusate sodium (COLACE) capsule 100 mg, 100 mg, Oral, BID, Judith Part, MD, 100 mg at  08/07/19 2104 .  gabapentin (NEURONTIN) tablet 600 mg, 600 mg, Oral, BID, Judith Part, MD, 600 mg at 08/07/19 2104 .  HYDROmorphone (DILAUDID) injection 1 mg, 1 mg, Intravenous, Q3H PRN, Judith Part, MD .  lisinopril (ZESTRIL) tablet 40 mg, 40 mg, Oral, BID, Judith Part, MD, 40 mg at 08/07/19 2104 .  menthol-cetylpyridinium (CEPACOL) lozenge 3 mg, 1 lozenge, Oral, PRN **OR** phenol (CHLORASEPTIC) mouth spray 1 spray, 1 spray, Mouth/Throat, PRN, Marshal Schrecengost, Joyice Faster, MD .  metoprolol succinate (TOPROL-XL) 24 hr tablet 25 mg, 25 mg, Oral, Daily, Viveka Wilmeth A, MD .  ondansetron (ZOFRAN) tablet 4 mg, 4 mg, Oral, Q6H PRN **OR** ondansetron (ZOFRAN) injection 4 mg, 4 mg, Intravenous, Q6H PRN, Chanetta Moosman A, MD .  oxyCODONE (Oxy IR/ROXICODONE) immediate release tablet 10 mg, 10 mg, Oral, Q4H PRN, Judith Part, MD, 10 mg at 08/08/19 0515 .  oxyCODONE (Oxy IR/ROXICODONE) immediate release tablet 5 mg, 5 mg, Oral, Q4H PRN, Zehava Turski A, MD .  pantoprazole (PROTONIX) EC tablet 40 mg, 40 mg, Oral, Daily PRN, Uri Turnbough A, MD .  polyethylene glycol (MIRALAX / GLYCOLAX) packet 17 g, 17 g, Oral, Daily PRN, Judith Part, MD .  promethazine (PHENERGAN) tablet 25 mg, 25 mg, Oral, Q12H PRN,  Judith Part, MD .  sertraline (ZOLOFT) tablet 100 mg, 100 mg, Oral, Daily, Judith Part, MD, 100 mg at 08/07/19 1635 .  sodium chloride flush (NS) 0.9 % injection 3 mL, 3 mL, Intravenous, Q12H, Kiyla Ringler, Joyice Faster, MD, 3 mL at 08/07/19 2148 .  sodium chloride flush (NS) 0.9 % injection 3 mL, 3 mL, Intravenous, PRN, Judith Part, MD .  spironolactone (ALDACTONE) tablet 25 mg, 25 mg, Oral, Daily, Jhoselyn Ruffini, Joyice Faster, MD   Physical Exam: AOx3, PERRL, EOMI, FS, Strength 5/5 x4, SILTx4 Incisions c/d/i  Assessment & Plan: 59 y.o. woman s/p L4-5 MIS TLIF, recovering well.  -discharge home today  Judith Part  08/08/19 7:17 AM

## 2019-08-08 NOTE — Evaluation (Signed)
Physical Therapy Evaluation Patient Details Name: Leslie Roman MRN: [id number] DOB: 11-21-1960 Today's Date: 08/08/2019   History of Present Illness  59 y.o. woman with left lower extremity radicular pain. S/p L Lumbar 4-5 fusion.  Clinical Impression  Patient is s/p above surgery resulting in the deficits listed below (see PT Problem List). Pt functioning at supervision level. Pt with good understanding of back precautions. Encouraged pt to use RW initially as pt with significant L lateral bending compensation and antalgia without. Assisted pt into car for d/c. Pt with no further acute PT needs at this time.     Follow Up Recommendations No PT follow up;Supervision for mobility/OOB    Equipment Recommendations  Rolling walker with 5" wheels(pt reports she has one)    Recommendations for Other Services       Precautions / Restrictions Restrictions Weight Bearing Restrictions: No      Mobility  Bed Mobility               General bed mobility comments: pt sitting EOB  Transfers Overall transfer level: Needs assistance Equipment used: None Transfers: Sit to/from Stand Sit to Stand: Min guard         General transfer comment: verbal cues to push up from arm rests and minimize bending, no physical assist needed  Ambulation/Gait Ambulation/Gait assistance: Min guard;Supervision Gait Distance (Feet): 100 Feet Assistive device: None;Rolling walker (2 wheeled) Gait Pattern/deviations: Step-to pattern;Decreased stride length;Antalgic Gait velocity: dec Gait velocity interpretation: <1.31 ft/sec, indicative of household ambulator General Gait Details: pt began amb without AD and was very antalgic  with strong over compensated L lateral bending. pt given a RW pt with more upright posture, smoother more fluent gait pattern and signifcantly less antalgia, encouraged pt to use RW until she can ambulate without significant L lateral lean  Stairs Stairs: Yes Stairs  assistance: Min guard Stair Management: One rail Left;Step to pattern;Sideways Number of Stairs: 3(to mimic home set up) General stair comments: pt with good understanding and return demonstration  Wheelchair Mobility    Modified Rankin (Stroke Patients Only)       Balance Overall balance assessment: Needs assistance Sitting-balance support: Feet supported;No upper extremity supported Sitting balance-Leahy Scale: Good     Standing balance support: Single extremity supported Standing balance-Leahy Scale: Fair Standing balance comment: pt able to maintain static balance however requires assist for safe amb                             Pertinent Vitals/Pain Pain Assessment: 0-10 Pain Score: 7  Pain Location: back Pain Descriptors / Indicators: Radiating    Home Living Family/patient expects to be discharged to:: Private residence Living Arrangements: Spouse/significant other Available Help at Discharge: Family;Available 24 hours/day Type of Home: House Home Access: Stairs to enter Entrance Stairs-Rails: Can reach both Entrance Stairs-Number of Steps: 2 Home Layout: One level Home Equipment: Cane - single point;Bedside commode;Shower seat;Wheelchair - manual      Prior Function Level of Independence: Needs assistance   Gait / Transfers Assistance Needed: SPC for ambulation  ADL's / Homemaking Assistance Needed: Pt performing LB ADL with increased time and some assist from pt and pt performing IADLs with assist from spouse.        Hand Dominance        Extremity/Trunk Assessment   Upper Extremity Assessment Upper Extremity Assessment: Defer to OT evaluation    Lower Extremity Assessment Lower Extremity Assessment: Generalized weakness(from surgery)  Communication   Communication: No difficulties  Cognition Arousal/Alertness: Awake/alert Behavior During Therapy: WFL for tasks assessed/performed Overall Cognitive Status: Within Functional  Limits for tasks assessed                                        General Comments General comments (skin integrity, edema, etc.): incision covered, not observed    Exercises     Assessment/Plan    PT Assessment Patent does not need any further PT services(pt d/c'd)  PT Problem List         PT Treatment Interventions      PT Goals (Current goals can be found in the Care Plan section)  Acute Rehab PT Goals Patient Stated Goal: home PT Goal Formulation: All assessment and education complete, DC therapy    Frequency     Barriers to discharge        Co-evaluation               AM-PAC PT "6 Clicks" Mobility  Outcome Measure Help needed turning from your back to your side while in a flat bed without using bedrails?: None Help needed moving from lying on your back to sitting on the side of a flat bed without using bedrails?: None Help needed moving to and from a bed to a chair (including a wheelchair)?: None Help needed standing up from a chair using your arms (e.g., wheelchair or bedside chair)?: A Little Help needed to walk in hospital room?: A Little Help needed climbing 3-5 steps with a railing? : A Little 6 Click Score: 21    End of Session Equipment Utilized During Treatment: Gait belt Activity Tolerance: Patient tolerated treatment well Patient left: (assist pt into car) Nurse Communication: Mobility status PT Visit Diagnosis: Difficulty in walking, not elsewhere classified (R26.2)    Time: [id number] PT Time Calculation (min) (ACUTE ONLY): 11 min   Charges:   PT Evaluation $PT Eval Moderate Complexity: 1 Mod          Kittie Plater, PT, DPT Acute Rehabilitation Services Pager #: [phone] Office #: [phone]   Berline Lopes 08/08/2019, 10:49 AM

## 2019-08-08 NOTE — Discharge Instructions (Signed)
Discharge Instructions ° °No restriction in activities, slowly increase your activity back to normal.  ° °Your incision is closed with dermabond (purple glue). This will naturally fall off over the next 1-2 weeks.  ° °Okay to shower on the day of discharge. Use regular soap and water and try to be gentle when cleaning your incision.  ° °Follow up with Dr. Muhammadali Ries in 2 weeks after discharge. If you do not already have a discharge appointment, please call his office at [phone] to schedule a follow up appointment. If you have any concerns or questions, please call the office and let us know. °

## 2019-08-08 NOTE — Plan of Care (Signed)
Pt doing well. Pt given D/C instructions with verbal understanding. Rx's were sent to pharmacy by MD. Pt's incisions are clean and dry with no sign of infection. Pt's IV was removed prior to D/C. Pt D/C'd home via wheelchair per MD order. Pt is stable @ D/C and has no other needs at this time. Holli Humbles, RN

## 2019-08-08 NOTE — Evaluation (Signed)
Occupational Therapy Evaluation Patient Details Name: Leslie Roman MRN: [id number] DOB: [birth year] Today's Date: 08/08/2019    History of Present Illness 59 y.o. woman with left lower extremity radicular pain. S/p L Lumbar 4-5 fusion.   Clinical Impression   Pt PTA: Pt lives at home with spouse and reports requiring increased assist due to BLE pain and immobility for LB ADL. Pt was independent prior, but pt drove very seldom. Pt currently Pt performing LB ADL tasks with figure 4 technique. Pt set-upA for UB ADL and able to donn brace without physical assist. Pt reports that she has family support at home.  Back handout provided and reviewed ADL in detail. Pt educated on: clothing between brace, never sleep in brace, set an alarm at night for medication, avoid sitting for long periods of time, correct bed positioning for sleeping, correct sequence for bed mobility, avoiding lifting more than 5 pounds and never wash directly over incision. All education is complete and patient indicates understanding. Pt does not require continued OT skilled services. OT signing off.      Follow Up Recommendations  No OT follow up    Equipment Recommendations  None recommended by OT    Recommendations for Other Services       Precautions / Restrictions Precautions Precautions: Fall Restrictions Weight Bearing Restrictions: No      Mobility Bed Mobility Overal bed mobility: Needs Assistance Bed Mobility: Supine to Sit     Supine to sit: Supervision     General bed mobility comments: cues for log roll  Transfers Overall transfer level: Needs assistance Equipment used: None Transfers: Sit to/from Stand Sit to Stand: Min guard         General transfer comment: verbal cues to push up from arm rests and minimize bending, no physical assist needed    Balance Overall balance assessment: Needs assistance Sitting-balance support: Feet supported;No upper extremity supported Sitting  balance-Leahy Scale: Good     Standing balance support: Single extremity supported Standing balance-Leahy Scale: Fair Standing balance comment: pt able to maintain static balance however requires assist for safe amb                           ADL either performed or assessed with clinical judgement   ADL Overall ADL's : At baseline                                       General ADL Comments: Pt performing LB ADL tasks with figure 4 technique. Pt set-upA for UB ADL and able to donn brace without physical assist. Pt reports that she has family support at home.     Vision Baseline Vision/History: No visual deficits Vision Assessment?: No apparent visual deficits     Perception     Praxis      Pertinent Vitals/Pain Pain Assessment: 0-10 Pain Score: 7  Pain Location: back Pain Descriptors / Indicators: Radiating Pain Intervention(s): Monitored during session     Hand Dominance Right   Extremity/Trunk Assessment Upper Extremity Assessment Upper Extremity Assessment: Overall WFL for tasks assessed   Lower Extremity Assessment Lower Extremity Assessment: Generalized weakness   Cervical / Trunk Assessment Cervical / Trunk Assessment: Other exceptions Cervical / Trunk Exceptions: s/p back sx   Communication Communication Communication: No difficulties   Cognition Arousal/Alertness: Awake/alert Behavior During Therapy: WFL for tasks assessed/performed Overall Cognitive Status:  Within Functional Limits for tasks assessed                                     General Comments  incision covered, not observed    Exercises     Shoulder Instructions      Home Living Family/patient expects to be discharged to:: Private residence Living Arrangements: Spouse/significant other Available Help at Discharge: Family;Available 24 hours/day Type of Home: House Home Access: Stairs to enter [employer] of Steps: 2 Entrance  Stairs-Rails: Can reach both Home Layout: One level     Bathroom Shower/Tub: Tub/shower unit;Walk-in shower   Bathroom Toilet: Standard     Home Equipment: Cane - single point;Bedside commode;Shower seat;Wheelchair - manual          Prior Functioning/Environment Level of Independence: Needs assistance  Gait / Transfers Assistance Needed: SPC for ambulation ADL's / Homemaking Assistance Needed: Pt performing LB ADL with increased time and some assist from pt and pt performing IADLs with assist from spouse.            OT Problem List: Decreased strength;Decreased activity tolerance;Pain      OT Treatment/Interventions:      OT Goals(Current goals can be found in the care plan section) Acute Rehab OT Goals Patient Stated Goal: home  OT Frequency:     Barriers to D/C:            Co-evaluation              AM-PAC OT "6 Clicks" Daily Activity     Outcome Measure Help from another person eating meals?: None Help from another person taking care of personal grooming?: None Help from another person toileting, which includes using toliet, bedpan, or urinal?: None Help from another person bathing (including washing, rinsing, drying)?: A Little Help from another person to put on and taking off regular upper body clothing?: None Help from another person to put on and taking off regular lower body clothing?: A Little 6 Click Score: 22   End of Session Nurse Communication: Mobility status  Activity Tolerance: Patient tolerated treatment well Patient left: in bed;with call bell/phone within reach  OT Visit Diagnosis: Muscle weakness (generalized) (M62.81);Pain Pain - part of body: (back)                Time: [id number] OT Time Calculation (min): 19 min Charges:  OT General Charges $OT Visit: 1 Visit OT Evaluation $OT Eval Moderate Complexity: 1 Mod  Jefferey Pica, OTR/L Acute Rehabilitation Services Pager: [phone] Office: [place] 08/08/2019, 2:02 PM

## 2019-08-08 NOTE — Discharge Summary (Signed)
Discharge Summary  Date of Admission: 08/07/2019  Date of Discharge: 08/08/19  Attending Physician: Emelda Brothers, MD  Hospital Course: Patient was admitted following an uncomplicated [id number] MIS TLIF. She was recovered in PACU and transferred to [facility]. Her hospital course was uncomplicated and the patient was discharged home on 08/08/19. She will follow up in clinic with me in 2 weeks.  Neurologic exam at discharge:  AOx3, PERRL, EOMI, FS, TM Strength 5/5 x4, SILTx4  Discharge diagnosis: Lumbar radiculopathy  Judith Part, MD 08/08/19 7:17 AM

## 2020-11-07 DIAGNOSIS — R001 Bradycardia, unspecified: Secondary | ICD-10-CM | POA: Diagnosis not present

## 2021-01-14 DIAGNOSIS — I493 Ventricular premature depolarization: Secondary | ICD-10-CM

## 2021-06-04 DIAGNOSIS — M1711 Unilateral primary osteoarthritis, right knee: Secondary | ICD-10-CM | POA: Diagnosis not present

## 2021-06-04 DIAGNOSIS — M1611 Unilateral primary osteoarthritis, right hip: Secondary | ICD-10-CM | POA: Diagnosis not present

## 2021-06-06 ENCOUNTER — Other Ambulatory Visit: Payer: Self-pay | Admitting: Neurological Surgery

## 2021-06-06 DIAGNOSIS — M7070 Other bursitis of hip, unspecified hip: Secondary | ICD-10-CM | POA: Diagnosis not present

## 2021-06-06 DIAGNOSIS — Z6832 Body mass index (BMI) 32.0-32.9, adult: Secondary | ICD-10-CM | POA: Diagnosis not present

## 2021-06-06 DIAGNOSIS — I10 Essential (primary) hypertension: Secondary | ICD-10-CM | POA: Diagnosis not present

## 2021-06-06 DIAGNOSIS — M5416 Radiculopathy, lumbar region: Secondary | ICD-10-CM | POA: Diagnosis not present

## 2021-06-06 DIAGNOSIS — M960 Pseudarthrosis after fusion or arthrodesis: Secondary | ICD-10-CM

## 2021-07-01 ENCOUNTER — Other Ambulatory Visit: Payer: Self-pay

## 2021-07-01 ENCOUNTER — Ambulatory Visit
Admission: RE | Admit: 2021-07-01 | Discharge: 2021-07-01 | Disposition: A | Discharge status: Home / Self Care | Payer: PPO | Source: Ambulatory Visit | Attending: Neurological Surgery | Admitting: Neurological Surgery

## 2021-07-01 DIAGNOSIS — M545 Low back pain, unspecified: Secondary | ICD-10-CM | POA: Diagnosis not present

## 2021-07-01 DIAGNOSIS — M960 Pseudarthrosis after fusion or arthrodesis: Secondary | ICD-10-CM

## 2021-07-01 DIAGNOSIS — M8588 Other specified disorders of bone density and structure, other site: Secondary | ICD-10-CM | POA: Diagnosis not present

## 2021-07-11 DIAGNOSIS — Z1231 Encounter for screening mammogram for malignant neoplasm of breast: Secondary | ICD-10-CM | POA: Diagnosis not present

## 2021-07-11 DIAGNOSIS — E2839 Other primary ovarian failure: Secondary | ICD-10-CM | POA: Diagnosis not present

## 2021-07-11 DIAGNOSIS — Z8739 Personal history of other diseases of the musculoskeletal system and connective tissue: Secondary | ICD-10-CM | POA: Diagnosis not present

## 2021-07-15 DIAGNOSIS — Z79899 Other long term (current) drug therapy: Secondary | ICD-10-CM | POA: Diagnosis not present

## 2021-07-21 DIAGNOSIS — M810 Age-related osteoporosis without current pathological fracture: Secondary | ICD-10-CM | POA: Diagnosis not present

## 2021-07-25 DIAGNOSIS — M960 Pseudarthrosis after fusion or arthrodesis: Secondary | ICD-10-CM | POA: Diagnosis not present

## 2021-07-25 DIAGNOSIS — Z6832 Body mass index (BMI) 32.0-32.9, adult: Secondary | ICD-10-CM | POA: Diagnosis not present

## 2021-07-25 DIAGNOSIS — I10 Essential (primary) hypertension: Secondary | ICD-10-CM | POA: Diagnosis not present

## 2021-08-04 DIAGNOSIS — M1711 Unilateral primary osteoarthritis, right knee: Secondary | ICD-10-CM | POA: Diagnosis not present

## 2021-08-08 DIAGNOSIS — Z6833 Body mass index (BMI) 33.0-33.9, adult: Secondary | ICD-10-CM | POA: Diagnosis not present

## 2021-08-08 DIAGNOSIS — G43909 Migraine, unspecified, not intractable, without status migrainosus: Secondary | ICD-10-CM | POA: Diagnosis not present

## 2021-08-19 DIAGNOSIS — Z7982 Long term (current) use of aspirin: Secondary | ICD-10-CM | POA: Diagnosis not present

## 2021-08-19 DIAGNOSIS — I2510 Atherosclerotic heart disease of native coronary artery without angina pectoris: Secondary | ICD-10-CM | POA: Diagnosis not present

## 2021-08-19 DIAGNOSIS — E782 Mixed hyperlipidemia: Secondary | ICD-10-CM | POA: Diagnosis not present

## 2021-08-19 DIAGNOSIS — I10 Essential (primary) hypertension: Secondary | ICD-10-CM | POA: Diagnosis not present

## 2021-08-19 DIAGNOSIS — Z720 Tobacco use: Secondary | ICD-10-CM | POA: Diagnosis not present

## 2021-08-22 DIAGNOSIS — J209 Acute bronchitis, unspecified: Secondary | ICD-10-CM | POA: Diagnosis not present

## 2021-08-28 DIAGNOSIS — G43909 Migraine, unspecified, not intractable, without status migrainosus: Secondary | ICD-10-CM | POA: Diagnosis not present

## 2021-08-28 DIAGNOSIS — Z6833 Body mass index (BMI) 33.0-33.9, adult: Secondary | ICD-10-CM | POA: Diagnosis not present

## 2021-08-28 DIAGNOSIS — F419 Anxiety disorder, unspecified: Secondary | ICD-10-CM | POA: Diagnosis not present

## 2021-08-28 DIAGNOSIS — R062 Wheezing: Secondary | ICD-10-CM | POA: Diagnosis not present

## 2021-09-19 DIAGNOSIS — G43909 Migraine, unspecified, not intractable, without status migrainosus: Secondary | ICD-10-CM | POA: Diagnosis not present

## 2021-09-19 DIAGNOSIS — F419 Anxiety disorder, unspecified: Secondary | ICD-10-CM | POA: Diagnosis not present

## 2021-09-19 DIAGNOSIS — Z6832 Body mass index (BMI) 32.0-32.9, adult: Secondary | ICD-10-CM | POA: Diagnosis not present

## 2021-10-08 DIAGNOSIS — E7800 Pure hypercholesterolemia, unspecified: Secondary | ICD-10-CM | POA: Diagnosis not present

## 2021-10-08 DIAGNOSIS — Z6833 Body mass index (BMI) 33.0-33.9, adult: Secondary | ICD-10-CM | POA: Diagnosis not present

## 2021-10-08 DIAGNOSIS — Z122 Encounter for screening for malignant neoplasm of respiratory organs: Secondary | ICD-10-CM | POA: Diagnosis not present

## 2021-10-08 DIAGNOSIS — E669 Obesity, unspecified: Secondary | ICD-10-CM | POA: Diagnosis not present

## 2021-10-08 DIAGNOSIS — Z79899 Other long term (current) drug therapy: Secondary | ICD-10-CM | POA: Diagnosis not present

## 2021-10-10 DIAGNOSIS — I2510 Atherosclerotic heart disease of native coronary artery without angina pectoris: Secondary | ICD-10-CM | POA: Diagnosis not present

## 2021-10-10 DIAGNOSIS — J432 Centrilobular emphysema: Secondary | ICD-10-CM | POA: Diagnosis not present

## 2021-10-10 DIAGNOSIS — Z122 Encounter for screening for malignant neoplasm of respiratory organs: Secondary | ICD-10-CM | POA: Diagnosis not present

## 2021-10-10 DIAGNOSIS — I700 Atherosclerosis of aorta: Secondary | ICD-10-CM | POA: Diagnosis not present

## 2021-10-10 DIAGNOSIS — F17210 Nicotine dependence, cigarettes, uncomplicated: Secondary | ICD-10-CM | POA: Diagnosis not present

## 2022-01-12 DIAGNOSIS — M1711 Unilateral primary osteoarthritis, right knee: Secondary | ICD-10-CM | POA: Diagnosis not present

## 2022-02-17 ENCOUNTER — Encounter: Payer: Self-pay | Admitting: Gastroenterology

## 2022-03-17 DIAGNOSIS — M810 Age-related osteoporosis without current pathological fracture: Secondary | ICD-10-CM | POA: Diagnosis not present

## 2022-03-17 DIAGNOSIS — Z79899 Other long term (current) drug therapy: Secondary | ICD-10-CM | POA: Diagnosis not present

## 2022-03-17 DIAGNOSIS — J449 Chronic obstructive pulmonary disease, unspecified: Secondary | ICD-10-CM | POA: Diagnosis not present

## 2022-03-17 DIAGNOSIS — M47819 Spondylosis without myelopathy or radiculopathy, site unspecified: Secondary | ICD-10-CM | POA: Diagnosis not present

## 2022-03-17 DIAGNOSIS — I10 Essential (primary) hypertension: Secondary | ICD-10-CM | POA: Diagnosis not present

## 2022-03-17 DIAGNOSIS — R42 Dizziness and giddiness: Secondary | ICD-10-CM | POA: Diagnosis not present

## 2022-03-17 DIAGNOSIS — R110 Nausea: Secondary | ICD-10-CM | POA: Diagnosis not present

## 2022-03-17 DIAGNOSIS — I25119 Atherosclerotic heart disease of native coronary artery with unspecified angina pectoris: Secondary | ICD-10-CM | POA: Diagnosis not present

## 2022-03-17 DIAGNOSIS — Z86010 Personal history of colonic polyps: Secondary | ICD-10-CM | POA: Diagnosis not present

## 2022-03-17 DIAGNOSIS — E7800 Pure hypercholesterolemia, unspecified: Secondary | ICD-10-CM | POA: Diagnosis not present

## 2022-03-17 DIAGNOSIS — I700 Atherosclerosis of aorta: Secondary | ICD-10-CM | POA: Diagnosis not present

## 2022-03-17 DIAGNOSIS — R7301 Impaired fasting glucose: Secondary | ICD-10-CM | POA: Diagnosis not present

## 2022-04-02 ENCOUNTER — Telehealth: Payer: Self-pay

## 2022-04-02 NOTE — Patient Outreach (Signed)
  Care Coordination   04/02/2022 Name: Leslie Roman MRN: [id number] DOB: [birth year]   Care Coordination Outreach Attempts:  An unsuccessful telephone outreach was attempted today to offer the patient information about available care coordination services as a benefit of their health plan.   Follow Up Plan:  Additional outreach attempts will be made to offer the patient care coordination information and services.   Encounter Outcome:  No Answer  Care Coordination Interventions Activated:  No   Care Coordination Interventions:  No, not indicated    Tomasa Rand, RN, BSN, CEN [facility] [employer] [phone]

## 2022-04-02 NOTE — Patient Outreach (Signed)
  Care Coordination   Initial Visit Note   04/02/2022 Name: Leslie Roman MRN: [id number] DOB: 1960-12-16  Leslie Roman is a 61 y.o. year old female who sees Leslie Salter, NP for primary care. I spoke with  Leslie Roman by phone today.  What matters to the patients health and wellness today?  Patient called back after missing previous call. Reviewed and explained [facility] care coordination program.  Patient reports she is doing well and denies any needs at this time.    SDOH assessments and interventions completed:  No     Care Coordination Interventions Activated:  No  Care Coordination Interventions:  No, not indicated   Follow up plan: No further intervention required.   Encounter Outcome:  Pt. Refused   Leslie Rand, RN, BSN, CEN [facility] [employer] [phone]

## 2022-04-13 DIAGNOSIS — R42 Dizziness and giddiness: Secondary | ICD-10-CM | POA: Diagnosis not present

## 2022-04-13 DIAGNOSIS — I6523 Occlusion and stenosis of bilateral carotid arteries: Secondary | ICD-10-CM | POA: Diagnosis not present

## 2022-04-16 DIAGNOSIS — D72829 Elevated white blood cell count, unspecified: Secondary | ICD-10-CM | POA: Diagnosis not present

## 2022-05-27 DIAGNOSIS — M1990 Unspecified osteoarthritis, unspecified site: Secondary | ICD-10-CM | POA: Diagnosis not present

## 2022-05-27 DIAGNOSIS — I10 Essential (primary) hypertension: Secondary | ICD-10-CM | POA: Diagnosis not present

## 2022-05-27 DIAGNOSIS — J432 Centrilobular emphysema: Secondary | ICD-10-CM | POA: Diagnosis not present

## 2022-05-27 DIAGNOSIS — F17210 Nicotine dependence, cigarettes, uncomplicated: Secondary | ICD-10-CM | POA: Diagnosis not present

## 2022-05-27 DIAGNOSIS — G8929 Other chronic pain: Secondary | ICD-10-CM | POA: Diagnosis not present

## 2022-05-27 DIAGNOSIS — I2510 Atherosclerotic heart disease of native coronary artery without angina pectoris: Secondary | ICD-10-CM | POA: Diagnosis not present

## 2022-05-27 DIAGNOSIS — E669 Obesity, unspecified: Secondary | ICD-10-CM | POA: Diagnosis not present

## 2022-05-27 DIAGNOSIS — E559 Vitamin D deficiency, unspecified: Secondary | ICD-10-CM | POA: Diagnosis not present

## 2022-05-27 DIAGNOSIS — F419 Anxiety disorder, unspecified: Secondary | ICD-10-CM | POA: Diagnosis not present

## 2022-05-27 DIAGNOSIS — G43909 Migraine, unspecified, not intractable, without status migrainosus: Secondary | ICD-10-CM | POA: Diagnosis not present

## 2022-05-27 DIAGNOSIS — I700 Atherosclerosis of aorta: Secondary | ICD-10-CM | POA: Diagnosis not present

## 2022-05-27 DIAGNOSIS — E7800 Pure hypercholesterolemia, unspecified: Secondary | ICD-10-CM | POA: Diagnosis not present

## 2022-07-13 DIAGNOSIS — M1712 Unilateral primary osteoarthritis, left knee: Secondary | ICD-10-CM | POA: Diagnosis not present

## 2022-07-15 IMAGING — CT CT L SPINE W/O CM
1 of 7 series · 6 of 14 positions shown, 8 images · non-contrast
Comparison: Lumbar spine CT 06/21/2019

CLINICAL DATA: Pseudoarthrosis following spinal fusion. Low back
pain.



[Series 4: l spine soft · axial · 0.34mm/px · z∈[-427,-187]mm · 6 of 168 slices shown, 8 images]
[im 24/168  soft-tissue]
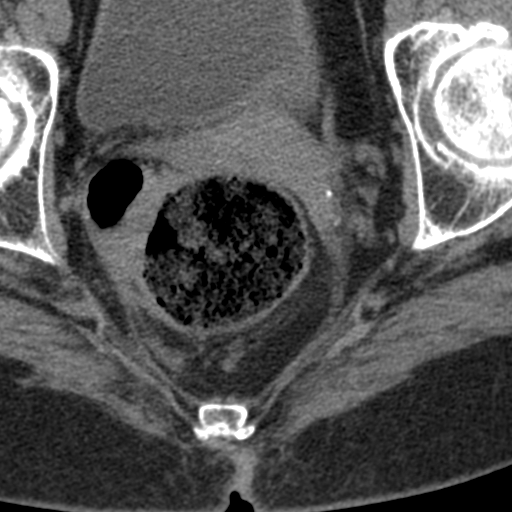
[im 24/168  bone]
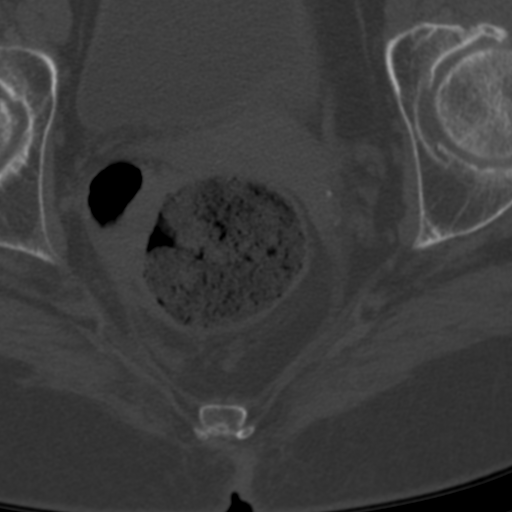
[im 48/168  bone]
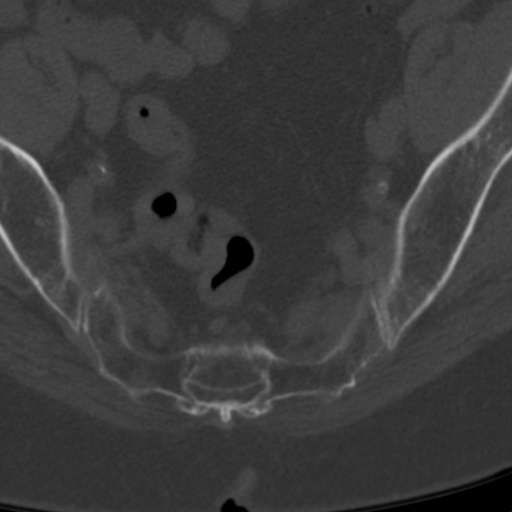
[im 72/168  bone]
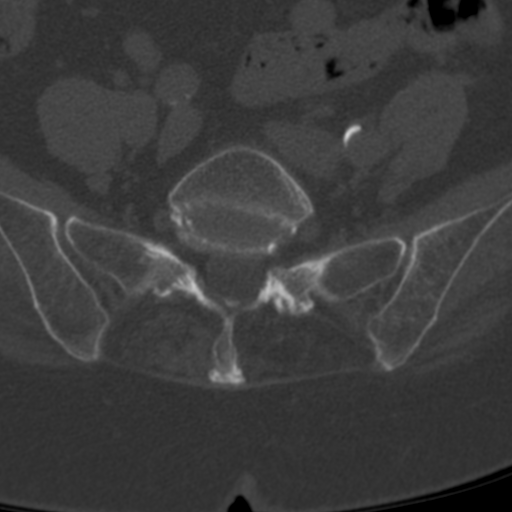
[im 96/168  bone]
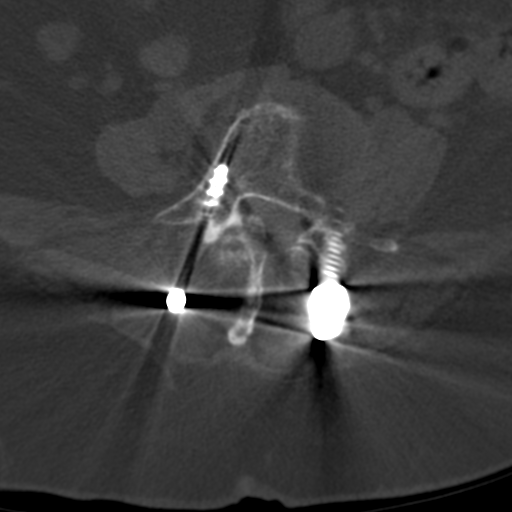
[im 120/168  soft-tissue]
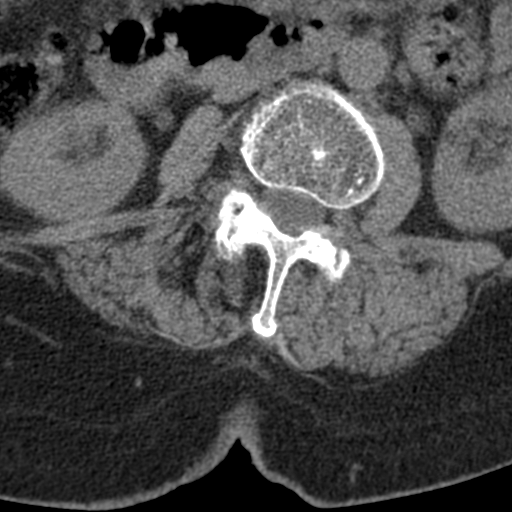
[im 120/168  bone]
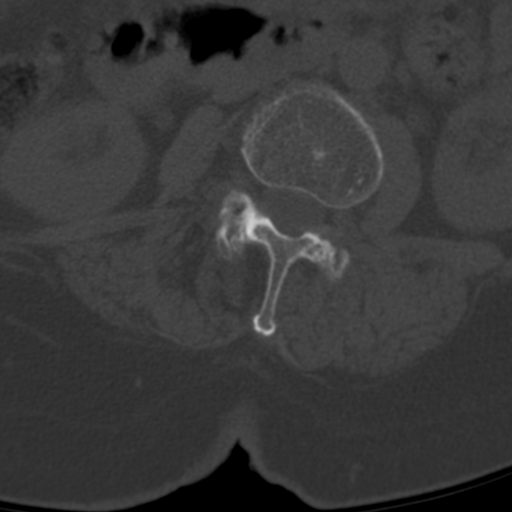
[im 144/168  bone]
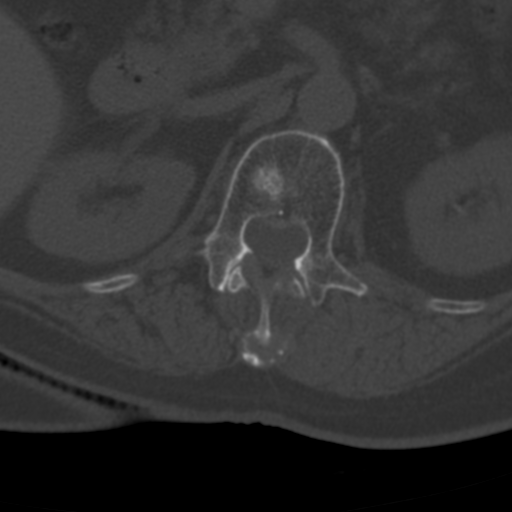

[6 of 14 positions shown; findings below may reference images not displayed]

FINDINGS: Segmentation: 5 lumbar type vertebrae.

Alignment: Moderate lumbar levoscoliosis. Trace anterolisthesis of
L3 on L4.

Vertebrae: No acute fracture or suspicious osseous lesion. Unchanged
chronic L1 and L3 superior endplate Schmorl's nodes. New left-sided
superior endplate Schmorl's node deformity at L4. Diffuse
osteopenia. The left L4 pedicle screw breaches the L4 superior
endplate, and the left L5 screw slightly breaches the medial cortex
of the pedicle. No evidence of screw loosening. There is subsidence
of the interbody implant into the L4 inferior endplate with
surrounding endplate sclerosis and lucency and with gas in the L4-5
disc space which may reflect ongoing motion at this level with
absence of solid arthrodesis.

Paraspinal and other soft tissues: Nonobstructing 2 mm left renal
stone. Abdominal aortic atherosclerosis without aneurysm.
Cholecystectomy.

Disc levels:

T12-L1: Mild disc bulging, mild endplate spurring, and mild facet
arthrosis without evidence of significant stenosis, unchanged.

L1-2: Mild left eccentric disc bulging and moderate right and mild
left facet arthrosis without evidence of significant stenosis,
unchanged.

L2-3: Disc bulging and moderate facet and ligamentum flavum
hypertrophy likely result in mild bilateral lateral recess stenosis
and mild-to-moderate right and mild left neural foraminal stenosis
without evidence of significant spinal stenosis, unchanged.

L3-4: Asymmetric right-sided disc space narrowing with vacuum disc.
Suboptimal assessment of the spinal canal due to streak artifact.
Circumferential disc bulging and severe facet and ligamentum flavum
hypertrophy result in likely at least moderate spinal stenosis and
severe right and mild left neural foraminal stenosis.

L4-5: Interval fusion. Limited visualization of the spinal canal due
to streak artifact. No evidence of significant osseous neural
foraminal stenosis.

L5-S1: Mild-to-moderate right and severe left facet arthrosis
without evidence of stenosis, unchanged.
IMPRESSION: 1. Interval L4-5 fusion as above.  No evidence of solid arthrodesis.
2. Suspected moderate spinal stenosis and severe right neural
foraminal stenosis at L3-4.
3. Nonobstructing left renal stone.
4. Aortic Atherosclerosis ([id number]).

## 2022-08-03 DIAGNOSIS — I1A0 Resistant hypertension: Secondary | ICD-10-CM | POA: Diagnosis not present

## 2022-08-03 DIAGNOSIS — R079 Chest pain, unspecified: Secondary | ICD-10-CM | POA: Diagnosis not present

## 2022-08-03 DIAGNOSIS — R9431 Abnormal electrocardiogram [ECG] [EKG]: Secondary | ICD-10-CM | POA: Diagnosis not present

## 2022-08-03 DIAGNOSIS — F17210 Nicotine dependence, cigarettes, uncomplicated: Secondary | ICD-10-CM | POA: Diagnosis not present

## 2022-08-03 DIAGNOSIS — I249 Acute ischemic heart disease, unspecified: Secondary | ICD-10-CM | POA: Diagnosis not present

## 2022-08-03 DIAGNOSIS — Z79899 Other long term (current) drug therapy: Secondary | ICD-10-CM | POA: Diagnosis not present

## 2022-08-03 DIAGNOSIS — R519 Headache, unspecified: Secondary | ICD-10-CM | POA: Diagnosis not present

## 2022-08-03 DIAGNOSIS — I10 Essential (primary) hypertension: Secondary | ICD-10-CM | POA: Diagnosis not present

## 2022-08-03 DIAGNOSIS — N200 Calculus of kidney: Secondary | ICD-10-CM | POA: Diagnosis not present

## 2022-08-03 DIAGNOSIS — I2510 Atherosclerotic heart disease of native coronary artery without angina pectoris: Secondary | ICD-10-CM | POA: Diagnosis not present

## 2022-08-04 DIAGNOSIS — R519 Headache, unspecified: Secondary | ICD-10-CM | POA: Diagnosis not present

## 2022-08-04 DIAGNOSIS — I10 Essential (primary) hypertension: Secondary | ICD-10-CM | POA: Diagnosis not present

## 2022-08-04 DIAGNOSIS — I1A0 Resistant hypertension: Secondary | ICD-10-CM | POA: Diagnosis not present

## 2022-08-10 DIAGNOSIS — E782 Mixed hyperlipidemia: Secondary | ICD-10-CM | POA: Diagnosis not present

## 2022-08-10 DIAGNOSIS — Z7982 Long term (current) use of aspirin: Secondary | ICD-10-CM | POA: Diagnosis not present

## 2022-08-10 DIAGNOSIS — R079 Chest pain, unspecified: Secondary | ICD-10-CM | POA: Diagnosis not present

## 2022-08-10 DIAGNOSIS — I10 Essential (primary) hypertension: Secondary | ICD-10-CM | POA: Diagnosis not present

## 2022-08-10 DIAGNOSIS — R002 Palpitations: Secondary | ICD-10-CM | POA: Diagnosis not present

## 2022-08-10 DIAGNOSIS — Z720 Tobacco use: Secondary | ICD-10-CM | POA: Diagnosis not present

## 2022-08-10 DIAGNOSIS — I2510 Atherosclerotic heart disease of native coronary artery without angina pectoris: Secondary | ICD-10-CM | POA: Diagnosis not present

## 2022-08-10 DIAGNOSIS — K219 Gastro-esophageal reflux disease without esophagitis: Secondary | ICD-10-CM | POA: Diagnosis not present

## 2022-08-13 DIAGNOSIS — Z6834 Body mass index (BMI) 34.0-34.9, adult: Secondary | ICD-10-CM | POA: Diagnosis not present

## 2022-08-13 DIAGNOSIS — M5442 Lumbago with sciatica, left side: Secondary | ICD-10-CM | POA: Diagnosis not present

## 2022-08-13 DIAGNOSIS — R110 Nausea: Secondary | ICD-10-CM | POA: Diagnosis not present

## 2022-08-13 DIAGNOSIS — Z1231 Encounter for screening mammogram for malignant neoplasm of breast: Secondary | ICD-10-CM | POA: Diagnosis not present

## 2022-08-13 DIAGNOSIS — F419 Anxiety disorder, unspecified: Secondary | ICD-10-CM | POA: Diagnosis not present

## 2022-08-13 DIAGNOSIS — Z0000 Encounter for general adult medical examination without abnormal findings: Secondary | ICD-10-CM | POA: Diagnosis not present

## 2022-08-13 DIAGNOSIS — K219 Gastro-esophageal reflux disease without esophagitis: Secondary | ICD-10-CM | POA: Diagnosis not present

## 2022-08-13 DIAGNOSIS — I10 Essential (primary) hypertension: Secondary | ICD-10-CM | POA: Diagnosis not present

## 2022-09-01 DIAGNOSIS — Z1231 Encounter for screening mammogram for malignant neoplasm of breast: Secondary | ICD-10-CM | POA: Diagnosis not present

## 2022-09-02 DIAGNOSIS — R002 Palpitations: Secondary | ICD-10-CM | POA: Diagnosis not present

## 2022-09-02 DIAGNOSIS — I10 Essential (primary) hypertension: Secondary | ICD-10-CM | POA: Diagnosis not present

## 2022-09-03 DIAGNOSIS — I471 Supraventricular tachycardia, unspecified: Secondary | ICD-10-CM | POA: Diagnosis not present

## 2022-09-07 DIAGNOSIS — M5412 Radiculopathy, cervical region: Secondary | ICD-10-CM | POA: Diagnosis not present

## 2022-09-11 DIAGNOSIS — M25512 Pain in left shoulder: Secondary | ICD-10-CM | POA: Diagnosis not present

## 2022-09-11 DIAGNOSIS — M542 Cervicalgia: Secondary | ICD-10-CM | POA: Diagnosis not present

## 2022-09-17 DIAGNOSIS — M542 Cervicalgia: Secondary | ICD-10-CM | POA: Diagnosis not present

## 2022-09-17 DIAGNOSIS — M25512 Pain in left shoulder: Secondary | ICD-10-CM | POA: Diagnosis not present

## 2022-09-21 DIAGNOSIS — M542 Cervicalgia: Secondary | ICD-10-CM | POA: Diagnosis not present

## 2022-09-21 DIAGNOSIS — M25512 Pain in left shoulder: Secondary | ICD-10-CM | POA: Diagnosis not present

## 2022-09-23 DIAGNOSIS — I2510 Atherosclerotic heart disease of native coronary artery without angina pectoris: Secondary | ICD-10-CM | POA: Diagnosis not present

## 2022-09-23 DIAGNOSIS — I10 Essential (primary) hypertension: Secondary | ICD-10-CM | POA: Diagnosis not present

## 2022-09-24 DIAGNOSIS — M25512 Pain in left shoulder: Secondary | ICD-10-CM | POA: Diagnosis not present

## 2022-09-24 DIAGNOSIS — M542 Cervicalgia: Secondary | ICD-10-CM | POA: Diagnosis not present

## 2022-09-25 DIAGNOSIS — R1013 Epigastric pain: Secondary | ICD-10-CM | POA: Diagnosis not present

## 2022-09-25 DIAGNOSIS — R198 Other specified symptoms and signs involving the digestive system and abdomen: Secondary | ICD-10-CM | POA: Diagnosis not present

## 2022-09-25 DIAGNOSIS — K219 Gastro-esophageal reflux disease without esophagitis: Secondary | ICD-10-CM | POA: Diagnosis not present

## 2022-09-25 DIAGNOSIS — R1310 Dysphagia, unspecified: Secondary | ICD-10-CM | POA: Diagnosis not present

## 2022-09-28 DIAGNOSIS — M25512 Pain in left shoulder: Secondary | ICD-10-CM | POA: Diagnosis not present

## 2022-09-28 DIAGNOSIS — M542 Cervicalgia: Secondary | ICD-10-CM | POA: Diagnosis not present

## 2022-10-01 DIAGNOSIS — M25512 Pain in left shoulder: Secondary | ICD-10-CM | POA: Diagnosis not present

## 2022-10-01 DIAGNOSIS — M542 Cervicalgia: Secondary | ICD-10-CM | POA: Diagnosis not present

## 2022-11-24 DIAGNOSIS — M7989 Other specified soft tissue disorders: Secondary | ICD-10-CM | POA: Diagnosis not present

## 2022-11-24 DIAGNOSIS — Z6834 Body mass index (BMI) 34.0-34.9, adult: Secondary | ICD-10-CM | POA: Diagnosis not present

## 2022-12-03 DIAGNOSIS — M25532 Pain in left wrist: Secondary | ICD-10-CM | POA: Diagnosis not present

## 2022-12-03 DIAGNOSIS — S6292XA Unspecified fracture of left wrist and hand, initial encounter for closed fracture: Secondary | ICD-10-CM | POA: Diagnosis not present

## 2022-12-07 DIAGNOSIS — S52532A Colles' fracture of left radius, initial encounter for closed fracture: Secondary | ICD-10-CM | POA: Diagnosis not present

## 2022-12-09 DIAGNOSIS — J029 Acute pharyngitis, unspecified: Secondary | ICD-10-CM | POA: Diagnosis not present

## 2022-12-09 DIAGNOSIS — U071 COVID-19: Secondary | ICD-10-CM | POA: Diagnosis not present

## 2022-12-09 DIAGNOSIS — Z6834 Body mass index (BMI) 34.0-34.9, adult: Secondary | ICD-10-CM | POA: Diagnosis not present

## 2022-12-21 DIAGNOSIS — S52532A Colles' fracture of left radius, initial encounter for closed fracture: Secondary | ICD-10-CM | POA: Diagnosis not present

## 2022-12-22 DIAGNOSIS — I10 Essential (primary) hypertension: Secondary | ICD-10-CM | POA: Diagnosis not present

## 2022-12-22 DIAGNOSIS — R5383 Other fatigue: Secondary | ICD-10-CM | POA: Diagnosis not present

## 2022-12-22 DIAGNOSIS — R7303 Prediabetes: Secondary | ICD-10-CM | POA: Diagnosis not present

## 2022-12-22 DIAGNOSIS — M810 Age-related osteoporosis without current pathological fracture: Secondary | ICD-10-CM | POA: Diagnosis not present

## 2022-12-22 DIAGNOSIS — H109 Unspecified conjunctivitis: Secondary | ICD-10-CM | POA: Diagnosis not present

## 2022-12-22 DIAGNOSIS — I25119 Atherosclerotic heart disease of native coronary artery with unspecified angina pectoris: Secondary | ICD-10-CM | POA: Diagnosis not present

## 2022-12-22 DIAGNOSIS — J329 Chronic sinusitis, unspecified: Secondary | ICD-10-CM | POA: Diagnosis not present

## 2022-12-22 DIAGNOSIS — E559 Vitamin D deficiency, unspecified: Secondary | ICD-10-CM | POA: Diagnosis not present

## 2022-12-22 DIAGNOSIS — Z6835 Body mass index (BMI) 35.0-35.9, adult: Secondary | ICD-10-CM | POA: Diagnosis not present

## 2022-12-22 DIAGNOSIS — E7800 Pure hypercholesterolemia, unspecified: Secondary | ICD-10-CM | POA: Diagnosis not present

## 2023-01-08 DIAGNOSIS — I4729 Other ventricular tachycardia: Secondary | ICD-10-CM | POA: Diagnosis not present

## 2023-01-08 DIAGNOSIS — I471 Supraventricular tachycardia, unspecified: Secondary | ICD-10-CM | POA: Diagnosis not present

## 2023-01-08 DIAGNOSIS — R9431 Abnormal electrocardiogram [ECG] [EKG]: Secondary | ICD-10-CM | POA: Diagnosis not present

## 2023-01-08 DIAGNOSIS — R42 Dizziness and giddiness: Secondary | ICD-10-CM | POA: Diagnosis not present

## 2023-01-08 DIAGNOSIS — I2510 Atherosclerotic heart disease of native coronary artery without angina pectoris: Secondary | ICD-10-CM | POA: Diagnosis not present

## 2023-01-08 DIAGNOSIS — I252 Old myocardial infarction: Secondary | ICD-10-CM | POA: Diagnosis not present

## 2023-01-08 DIAGNOSIS — E782 Mixed hyperlipidemia: Secondary | ICD-10-CM | POA: Diagnosis not present

## 2023-01-08 DIAGNOSIS — R55 Syncope and collapse: Secondary | ICD-10-CM | POA: Diagnosis not present

## 2023-01-08 DIAGNOSIS — G4733 Obstructive sleep apnea (adult) (pediatric): Secondary | ICD-10-CM | POA: Diagnosis not present

## 2023-01-08 DIAGNOSIS — I493 Ventricular premature depolarization: Secondary | ICD-10-CM | POA: Diagnosis not present

## 2023-01-08 DIAGNOSIS — I10 Essential (primary) hypertension: Secondary | ICD-10-CM | POA: Diagnosis not present

## 2023-01-08 DIAGNOSIS — R9439 Abnormal result of other cardiovascular function study: Secondary | ICD-10-CM | POA: Diagnosis not present

## 2023-01-09 DIAGNOSIS — I493 Ventricular premature depolarization: Secondary | ICD-10-CM | POA: Diagnosis not present

## 2023-01-18 DIAGNOSIS — S52572A Other intraarticular fracture of lower end of left radius, initial encounter for closed fracture: Secondary | ICD-10-CM | POA: Diagnosis not present

## 2023-01-18 DIAGNOSIS — S52532A Colles' fracture of left radius, initial encounter for closed fracture: Secondary | ICD-10-CM | POA: Diagnosis not present

## 2023-01-19 DIAGNOSIS — Z6834 Body mass index (BMI) 34.0-34.9, adult: Secondary | ICD-10-CM | POA: Diagnosis not present

## 2023-01-19 DIAGNOSIS — I2510 Atherosclerotic heart disease of native coronary artery without angina pectoris: Secondary | ICD-10-CM | POA: Diagnosis not present

## 2023-01-19 DIAGNOSIS — I252 Old myocardial infarction: Secondary | ICD-10-CM | POA: Diagnosis not present

## 2023-01-19 DIAGNOSIS — I4729 Other ventricular tachycardia: Secondary | ICD-10-CM | POA: Diagnosis not present

## 2023-01-19 DIAGNOSIS — Z720 Tobacco use: Secondary | ICD-10-CM | POA: Diagnosis not present

## 2023-01-19 DIAGNOSIS — Z7982 Long term (current) use of aspirin: Secondary | ICD-10-CM | POA: Diagnosis not present

## 2023-01-19 DIAGNOSIS — E782 Mixed hyperlipidemia: Secondary | ICD-10-CM | POA: Diagnosis not present

## 2023-01-19 DIAGNOSIS — I10 Essential (primary) hypertension: Secondary | ICD-10-CM | POA: Diagnosis not present

## 2023-02-03 DIAGNOSIS — I4729 Other ventricular tachycardia: Secondary | ICD-10-CM | POA: Diagnosis not present

## 2023-02-04 DIAGNOSIS — I4719 Other supraventricular tachycardia: Secondary | ICD-10-CM | POA: Diagnosis not present

## 2023-02-04 DIAGNOSIS — I4729 Other ventricular tachycardia: Secondary | ICD-10-CM | POA: Diagnosis not present

## 2023-02-04 DIAGNOSIS — I493 Ventricular premature depolarization: Secondary | ICD-10-CM | POA: Diagnosis not present

## 2023-02-08 DIAGNOSIS — I10 Essential (primary) hypertension: Secondary | ICD-10-CM | POA: Diagnosis not present

## 2023-02-08 DIAGNOSIS — E785 Hyperlipidemia, unspecified: Secondary | ICD-10-CM | POA: Diagnosis not present

## 2023-02-08 DIAGNOSIS — I252 Old myocardial infarction: Secondary | ICD-10-CM | POA: Diagnosis not present

## 2023-02-08 DIAGNOSIS — F17200 Nicotine dependence, unspecified, uncomplicated: Secondary | ICD-10-CM | POA: Diagnosis not present

## 2023-02-08 DIAGNOSIS — I2510 Atherosclerotic heart disease of native coronary artery without angina pectoris: Secondary | ICD-10-CM | POA: Diagnosis not present

## 2023-02-11 DIAGNOSIS — I2510 Atherosclerotic heart disease of native coronary artery without angina pectoris: Secondary | ICD-10-CM | POA: Diagnosis not present

## 2023-02-11 DIAGNOSIS — R42 Dizziness and giddiness: Secondary | ICD-10-CM | POA: Diagnosis not present

## 2023-02-11 DIAGNOSIS — Z7982 Long term (current) use of aspirin: Secondary | ICD-10-CM | POA: Diagnosis not present

## 2023-02-11 DIAGNOSIS — I4729 Other ventricular tachycardia: Secondary | ICD-10-CM | POA: Diagnosis not present

## 2023-02-11 DIAGNOSIS — Z720 Tobacco use: Secondary | ICD-10-CM | POA: Diagnosis not present

## 2023-02-11 DIAGNOSIS — R55 Syncope and collapse: Secondary | ICD-10-CM | POA: Diagnosis not present

## 2023-02-11 DIAGNOSIS — E782 Mixed hyperlipidemia: Secondary | ICD-10-CM | POA: Diagnosis not present

## 2023-02-11 DIAGNOSIS — R9439 Abnormal result of other cardiovascular function study: Secondary | ICD-10-CM | POA: Diagnosis not present

## 2023-02-11 DIAGNOSIS — I252 Old myocardial infarction: Secondary | ICD-10-CM | POA: Diagnosis not present

## 2023-02-17 DIAGNOSIS — I252 Old myocardial infarction: Secondary | ICD-10-CM | POA: Diagnosis not present

## 2023-02-17 DIAGNOSIS — F17210 Nicotine dependence, cigarettes, uncomplicated: Secondary | ICD-10-CM | POA: Diagnosis not present

## 2023-02-17 DIAGNOSIS — I2510 Atherosclerotic heart disease of native coronary artery without angina pectoris: Secondary | ICD-10-CM | POA: Diagnosis not present

## 2023-02-17 DIAGNOSIS — I10 Essential (primary) hypertension: Secondary | ICD-10-CM | POA: Diagnosis not present

## 2023-02-17 DIAGNOSIS — R55 Syncope and collapse: Secondary | ICD-10-CM | POA: Diagnosis not present

## 2023-02-17 DIAGNOSIS — I472 Ventricular tachycardia, unspecified: Secondary | ICD-10-CM | POA: Diagnosis not present

## 2023-02-17 DIAGNOSIS — E782 Mixed hyperlipidemia: Secondary | ICD-10-CM | POA: Diagnosis not present

## 2023-02-17 DIAGNOSIS — Z7982 Long term (current) use of aspirin: Secondary | ICD-10-CM | POA: Diagnosis not present

## 2023-03-09 DIAGNOSIS — I4891 Unspecified atrial fibrillation: Secondary | ICD-10-CM | POA: Diagnosis not present

## 2023-03-09 DIAGNOSIS — I4729 Other ventricular tachycardia: Secondary | ICD-10-CM | POA: Diagnosis not present

## 2023-03-09 DIAGNOSIS — I517 Cardiomegaly: Secondary | ICD-10-CM | POA: Diagnosis not present

## 2023-03-09 DIAGNOSIS — Z01818 Encounter for other preprocedural examination: Secondary | ICD-10-CM | POA: Diagnosis not present

## 2023-03-10 DIAGNOSIS — I471 Supraventricular tachycardia, unspecified: Secondary | ICD-10-CM | POA: Diagnosis not present

## 2023-03-10 DIAGNOSIS — E782 Mixed hyperlipidemia: Secondary | ICD-10-CM | POA: Diagnosis not present

## 2023-03-10 DIAGNOSIS — Z885 Allergy status to narcotic agent status: Secondary | ICD-10-CM | POA: Diagnosis not present

## 2023-03-10 DIAGNOSIS — Z91030 Bee allergy status: Secondary | ICD-10-CM | POA: Diagnosis not present

## 2023-03-10 DIAGNOSIS — I2510 Atherosclerotic heart disease of native coronary artery without angina pectoris: Secondary | ICD-10-CM | POA: Diagnosis not present

## 2023-03-10 DIAGNOSIS — I4729 Other ventricular tachycardia: Secondary | ICD-10-CM | POA: Diagnosis not present

## 2023-03-10 DIAGNOSIS — I472 Ventricular tachycardia, unspecified: Secondary | ICD-10-CM | POA: Diagnosis not present

## 2023-03-10 DIAGNOSIS — I10 Essential (primary) hypertension: Secondary | ICD-10-CM | POA: Diagnosis not present

## 2023-04-01 DIAGNOSIS — H52223 Regular astigmatism, bilateral: Secondary | ICD-10-CM | POA: Diagnosis not present

## 2023-04-01 DIAGNOSIS — H524 Presbyopia: Secondary | ICD-10-CM | POA: Diagnosis not present

## 2023-04-01 DIAGNOSIS — H25813 Combined forms of age-related cataract, bilateral: Secondary | ICD-10-CM | POA: Diagnosis not present

## 2023-04-01 DIAGNOSIS — H5203 Hypermetropia, bilateral: Secondary | ICD-10-CM | POA: Diagnosis not present

## 2023-04-26 DIAGNOSIS — Z6834 Body mass index (BMI) 34.0-34.9, adult: Secondary | ICD-10-CM | POA: Diagnosis not present

## 2023-04-26 DIAGNOSIS — F419 Anxiety disorder, unspecified: Secondary | ICD-10-CM | POA: Diagnosis not present

## 2023-04-26 DIAGNOSIS — R42 Dizziness and giddiness: Secondary | ICD-10-CM | POA: Diagnosis not present

## 2023-04-26 DIAGNOSIS — I25118 Atherosclerotic heart disease of native coronary artery with other forms of angina pectoris: Secondary | ICD-10-CM | POA: Diagnosis not present

## 2023-04-26 DIAGNOSIS — I10 Essential (primary) hypertension: Secondary | ICD-10-CM | POA: Diagnosis not present

## 2023-04-26 DIAGNOSIS — R110 Nausea: Secondary | ICD-10-CM | POA: Diagnosis not present

## 2023-05-07 DIAGNOSIS — R42 Dizziness and giddiness: Secondary | ICD-10-CM | POA: Diagnosis not present

## 2023-05-21 DIAGNOSIS — I4729 Other ventricular tachycardia: Secondary | ICD-10-CM | POA: Diagnosis not present

## 2023-05-21 DIAGNOSIS — E782 Mixed hyperlipidemia: Secondary | ICD-10-CM | POA: Diagnosis not present

## 2023-05-21 DIAGNOSIS — E661 Drug-induced obesity: Secondary | ICD-10-CM | POA: Diagnosis not present

## 2023-05-21 DIAGNOSIS — E66811 Obesity, class 1: Secondary | ICD-10-CM | POA: Diagnosis not present

## 2023-05-21 DIAGNOSIS — Z6833 Body mass index (BMI) 33.0-33.9, adult: Secondary | ICD-10-CM | POA: Diagnosis not present

## 2023-05-21 DIAGNOSIS — Z7982 Long term (current) use of aspirin: Secondary | ICD-10-CM | POA: Diagnosis not present

## 2023-05-21 DIAGNOSIS — R42 Dizziness and giddiness: Secondary | ICD-10-CM | POA: Diagnosis not present

## 2023-05-21 DIAGNOSIS — I2510 Atherosclerotic heart disease of native coronary artery without angina pectoris: Secondary | ICD-10-CM | POA: Diagnosis not present

## 2023-05-21 DIAGNOSIS — Z720 Tobacco use: Secondary | ICD-10-CM | POA: Diagnosis not present

## 2023-05-21 DIAGNOSIS — I10 Essential (primary) hypertension: Secondary | ICD-10-CM | POA: Diagnosis not present

## 2023-06-07 DIAGNOSIS — I10 Essential (primary) hypertension: Secondary | ICD-10-CM | POA: Diagnosis not present

## 2023-06-07 DIAGNOSIS — F419 Anxiety disorder, unspecified: Secondary | ICD-10-CM | POA: Diagnosis not present

## 2023-06-07 DIAGNOSIS — Z79899 Other long term (current) drug therapy: Secondary | ICD-10-CM | POA: Diagnosis not present

## 2023-06-07 DIAGNOSIS — I25119 Atherosclerotic heart disease of native coronary artery with unspecified angina pectoris: Secondary | ICD-10-CM | POA: Diagnosis not present

## 2023-06-07 DIAGNOSIS — I700 Atherosclerosis of aorta: Secondary | ICD-10-CM | POA: Diagnosis not present

## 2023-06-07 DIAGNOSIS — E7800 Pure hypercholesterolemia, unspecified: Secondary | ICD-10-CM | POA: Diagnosis not present

## 2023-06-07 DIAGNOSIS — E559 Vitamin D deficiency, unspecified: Secondary | ICD-10-CM | POA: Diagnosis not present

## 2023-06-07 DIAGNOSIS — G43909 Migraine, unspecified, not intractable, without status migrainosus: Secondary | ICD-10-CM | POA: Diagnosis not present

## 2023-06-07 DIAGNOSIS — Z6835 Body mass index (BMI) 35.0-35.9, adult: Secondary | ICD-10-CM | POA: Diagnosis not present

## 2023-06-07 DIAGNOSIS — R7301 Impaired fasting glucose: Secondary | ICD-10-CM | POA: Diagnosis not present

## 2023-06-10 DIAGNOSIS — E782 Mixed hyperlipidemia: Secondary | ICD-10-CM | POA: Diagnosis not present

## 2023-06-10 DIAGNOSIS — I10 Essential (primary) hypertension: Secondary | ICD-10-CM | POA: Diagnosis not present

## 2023-06-10 DIAGNOSIS — Z9889 Other specified postprocedural states: Secondary | ICD-10-CM | POA: Diagnosis not present

## 2023-06-10 DIAGNOSIS — I4729 Other ventricular tachycardia: Secondary | ICD-10-CM | POA: Diagnosis not present

## 2023-06-10 DIAGNOSIS — I2510 Atherosclerotic heart disease of native coronary artery without angina pectoris: Secondary | ICD-10-CM | POA: Diagnosis not present

## 2023-06-14 DIAGNOSIS — L578 Other skin changes due to chronic exposure to nonionizing radiation: Secondary | ICD-10-CM | POA: Diagnosis not present

## 2023-06-14 DIAGNOSIS — L821 Other seborrheic keratosis: Secondary | ICD-10-CM | POA: Diagnosis not present

## 2023-07-13 DIAGNOSIS — I4729 Other ventricular tachycardia: Secondary | ICD-10-CM | POA: Diagnosis not present

## 2023-07-13 DIAGNOSIS — E782 Mixed hyperlipidemia: Secondary | ICD-10-CM | POA: Diagnosis not present

## 2023-07-13 DIAGNOSIS — M1712 Unilateral primary osteoarthritis, left knee: Secondary | ICD-10-CM | POA: Diagnosis not present

## 2023-07-28 DIAGNOSIS — I472 Ventricular tachycardia, unspecified: Secondary | ICD-10-CM | POA: Diagnosis not present

## 2023-07-28 DIAGNOSIS — I4719 Other supraventricular tachycardia: Secondary | ICD-10-CM | POA: Diagnosis not present

## 2023-07-28 DIAGNOSIS — I4729 Other ventricular tachycardia: Secondary | ICD-10-CM | POA: Diagnosis not present

## 2023-07-28 DIAGNOSIS — I493 Ventricular premature depolarization: Secondary | ICD-10-CM | POA: Diagnosis not present

## 2023-08-25 DIAGNOSIS — G43709 Chronic migraine without aura, not intractable, without status migrainosus: Secondary | ICD-10-CM | POA: Diagnosis not present

## 2023-08-25 DIAGNOSIS — R42 Dizziness and giddiness: Secondary | ICD-10-CM | POA: Diagnosis not present

## 2023-08-25 DIAGNOSIS — Z87898 Personal history of other specified conditions: Secondary | ICD-10-CM | POA: Diagnosis not present

## 2023-09-06 DIAGNOSIS — Z6835 Body mass index (BMI) 35.0-35.9, adult: Secondary | ICD-10-CM | POA: Diagnosis not present

## 2023-09-06 DIAGNOSIS — H6692 Otitis media, unspecified, left ear: Secondary | ICD-10-CM | POA: Diagnosis not present

## 2023-09-06 DIAGNOSIS — H60502 Unspecified acute noninfective otitis externa, left ear: Secondary | ICD-10-CM | POA: Diagnosis not present

## 2023-09-06 DIAGNOSIS — B372 Candidiasis of skin and nail: Secondary | ICD-10-CM | POA: Diagnosis not present

## 2023-09-27 DIAGNOSIS — R519 Headache, unspecified: Secondary | ICD-10-CM | POA: Diagnosis not present

## 2023-09-27 DIAGNOSIS — R42 Dizziness and giddiness: Secondary | ICD-10-CM | POA: Diagnosis not present

## 2023-09-27 DIAGNOSIS — Z87898 Personal history of other specified conditions: Secondary | ICD-10-CM | POA: Diagnosis not present

## 2023-09-27 DIAGNOSIS — I6381 Other cerebral infarction due to occlusion or stenosis of small artery: Secondary | ICD-10-CM | POA: Diagnosis not present

## 2023-10-14 DIAGNOSIS — I25119 Atherosclerotic heart disease of native coronary artery with unspecified angina pectoris: Secondary | ICD-10-CM | POA: Diagnosis not present

## 2023-10-14 DIAGNOSIS — I10 Essential (primary) hypertension: Secondary | ICD-10-CM | POA: Diagnosis not present

## 2023-10-14 DIAGNOSIS — E2839 Other primary ovarian failure: Secondary | ICD-10-CM | POA: Diagnosis not present

## 2023-10-14 DIAGNOSIS — F419 Anxiety disorder, unspecified: Secondary | ICD-10-CM | POA: Diagnosis not present

## 2023-10-14 DIAGNOSIS — Z1331 Encounter for screening for depression: Secondary | ICD-10-CM | POA: Diagnosis not present

## 2023-10-14 DIAGNOSIS — R7301 Impaired fasting glucose: Secondary | ICD-10-CM | POA: Diagnosis not present

## 2023-10-14 DIAGNOSIS — Z0000 Encounter for general adult medical examination without abnormal findings: Secondary | ICD-10-CM | POA: Diagnosis not present

## 2023-10-14 DIAGNOSIS — Z79899 Other long term (current) drug therapy: Secondary | ICD-10-CM | POA: Diagnosis not present

## 2023-10-14 DIAGNOSIS — Z1231 Encounter for screening mammogram for malignant neoplasm of breast: Secondary | ICD-10-CM | POA: Diagnosis not present

## 2023-10-14 DIAGNOSIS — E559 Vitamin D deficiency, unspecified: Secondary | ICD-10-CM | POA: Diagnosis not present

## 2023-10-14 DIAGNOSIS — Z6835 Body mass index (BMI) 35.0-35.9, adult: Secondary | ICD-10-CM | POA: Diagnosis not present

## 2023-10-14 DIAGNOSIS — E7800 Pure hypercholesterolemia, unspecified: Secondary | ICD-10-CM | POA: Diagnosis not present

## 2023-11-16 DIAGNOSIS — R42 Dizziness and giddiness: Secondary | ICD-10-CM | POA: Diagnosis not present

## 2023-11-16 DIAGNOSIS — R55 Syncope and collapse: Secondary | ICD-10-CM | POA: Diagnosis not present

## 2023-11-16 DIAGNOSIS — Z6835 Body mass index (BMI) 35.0-35.9, adult: Secondary | ICD-10-CM | POA: Diagnosis not present

## 2023-11-16 DIAGNOSIS — E66812 Obesity, class 2: Secondary | ICD-10-CM | POA: Diagnosis not present

## 2023-11-16 DIAGNOSIS — Z7982 Long term (current) use of aspirin: Secondary | ICD-10-CM | POA: Diagnosis not present

## 2023-11-16 DIAGNOSIS — E6609 Other obesity due to excess calories: Secondary | ICD-10-CM | POA: Diagnosis not present

## 2023-11-16 DIAGNOSIS — E782 Mixed hyperlipidemia: Secondary | ICD-10-CM | POA: Diagnosis not present

## 2023-11-16 DIAGNOSIS — I10 Essential (primary) hypertension: Secondary | ICD-10-CM | POA: Diagnosis not present

## 2023-11-16 DIAGNOSIS — Z720 Tobacco use: Secondary | ICD-10-CM | POA: Diagnosis not present

## 2023-11-16 DIAGNOSIS — I2510 Atherosclerotic heart disease of native coronary artery without angina pectoris: Secondary | ICD-10-CM | POA: Diagnosis not present

## 2023-11-16 DIAGNOSIS — I4729 Other ventricular tachycardia: Secondary | ICD-10-CM | POA: Diagnosis not present

## 2023-12-17 DIAGNOSIS — M810 Age-related osteoporosis without current pathological fracture: Secondary | ICD-10-CM | POA: Diagnosis not present

## 2023-12-17 DIAGNOSIS — E2839 Other primary ovarian failure: Secondary | ICD-10-CM | POA: Diagnosis not present

## 2023-12-17 DIAGNOSIS — Z1231 Encounter for screening mammogram for malignant neoplasm of breast: Secondary | ICD-10-CM | POA: Diagnosis not present

## 2023-12-21 DIAGNOSIS — M810 Age-related osteoporosis without current pathological fracture: Secondary | ICD-10-CM | POA: Diagnosis not present

## 2023-12-21 DIAGNOSIS — E559 Vitamin D deficiency, unspecified: Secondary | ICD-10-CM | POA: Diagnosis not present

## 2023-12-21 DIAGNOSIS — E2839 Other primary ovarian failure: Secondary | ICD-10-CM | POA: Diagnosis not present

## 2024-01-18 DIAGNOSIS — I517 Cardiomegaly: Secondary | ICD-10-CM | POA: Diagnosis not present

## 2024-02-08 DIAGNOSIS — I472 Ventricular tachycardia, unspecified: Secondary | ICD-10-CM | POA: Diagnosis not present

## 2024-02-08 DIAGNOSIS — I471 Supraventricular tachycardia, unspecified: Secondary | ICD-10-CM | POA: Diagnosis not present

## 2024-02-08 DIAGNOSIS — I493 Ventricular premature depolarization: Secondary | ICD-10-CM | POA: Diagnosis not present

## 2024-02-17 DIAGNOSIS — J432 Centrilobular emphysema: Secondary | ICD-10-CM | POA: Diagnosis not present

## 2024-02-17 DIAGNOSIS — J449 Chronic obstructive pulmonary disease, unspecified: Secondary | ICD-10-CM | POA: Diagnosis not present

## 2024-02-17 DIAGNOSIS — I10 Essential (primary) hypertension: Secondary | ICD-10-CM | POA: Diagnosis not present

## 2024-02-17 DIAGNOSIS — R7301 Impaired fasting glucose: Secondary | ICD-10-CM | POA: Diagnosis not present

## 2024-02-17 DIAGNOSIS — F419 Anxiety disorder, unspecified: Secondary | ICD-10-CM | POA: Diagnosis not present

## 2024-02-17 DIAGNOSIS — Z79899 Other long term (current) drug therapy: Secondary | ICD-10-CM | POA: Diagnosis not present

## 2024-02-17 DIAGNOSIS — E7800 Pure hypercholesterolemia, unspecified: Secondary | ICD-10-CM | POA: Diagnosis not present

## 2024-02-17 DIAGNOSIS — M810 Age-related osteoporosis without current pathological fracture: Secondary | ICD-10-CM | POA: Diagnosis not present

## 2024-03-01 DIAGNOSIS — I2510 Atherosclerotic heart disease of native coronary artery without angina pectoris: Secondary | ICD-10-CM | POA: Diagnosis not present

## 2024-03-01 DIAGNOSIS — I4729 Other ventricular tachycardia: Secondary | ICD-10-CM | POA: Diagnosis not present

## 2024-03-02 DIAGNOSIS — I493 Ventricular premature depolarization: Secondary | ICD-10-CM | POA: Diagnosis not present

## 2024-03-02 DIAGNOSIS — I471 Supraventricular tachycardia, unspecified: Secondary | ICD-10-CM | POA: Diagnosis not present

## 2024-03-02 DIAGNOSIS — I472 Ventricular tachycardia, unspecified: Secondary | ICD-10-CM | POA: Diagnosis not present

## 2024-03-10 DIAGNOSIS — I491 Atrial premature depolarization: Secondary | ICD-10-CM | POA: Diagnosis not present

## 2024-03-29 ENCOUNTER — Telehealth: Payer: Self-pay | Admitting: Pharmacist

## 2024-03-29 NOTE — Progress Notes (Signed)
 Pharmacy Quality Measure Review  This patient is appearing on a report for being at risk of failing the adherence measure for cholesterol (statin) and hypertension (ACEi/ARB) medications this calendar year.   Medication: atorvastatin  80 mg  Last fill date: 12/13/2023 for 90 day supply  Medication: lisinopril  40 mg  Last fill date: 12/13/2023 for 90 day supply  Contacted pharmacy to facilitate refills. Spoke with patient, she confirms she does need refills at this time, per [facility], she is out of refills. I have requested [facility] to send refill requests to Cardiology office at patient's request.    Annabella Galla, PharmD Clinical Pharmacist [place] Direct Dial: [phone]

## 2024-05-01 DIAGNOSIS — I10 Essential (primary) hypertension: Secondary | ICD-10-CM | POA: Diagnosis not present

## 2024-05-01 DIAGNOSIS — I4729 Other ventricular tachycardia: Secondary | ICD-10-CM | POA: Diagnosis not present

## 2024-05-01 DIAGNOSIS — I493 Ventricular premature depolarization: Secondary | ICD-10-CM | POA: Diagnosis not present

## 2024-05-01 DIAGNOSIS — I471 Supraventricular tachycardia, unspecified: Secondary | ICD-10-CM | POA: Diagnosis not present

## 2024-05-05 DIAGNOSIS — I4729 Other ventricular tachycardia: Secondary | ICD-10-CM | POA: Diagnosis not present

## 2024-05-05 DIAGNOSIS — R55 Syncope and collapse: Secondary | ICD-10-CM | POA: Diagnosis not present

## 2024-05-05 DIAGNOSIS — R42 Dizziness and giddiness: Secondary | ICD-10-CM | POA: Diagnosis not present

## 2024-05-05 DIAGNOSIS — I159 Secondary hypertension, unspecified: Secondary | ICD-10-CM | POA: Diagnosis not present

## 2024-05-05 DIAGNOSIS — I2510 Atherosclerotic heart disease of native coronary artery without angina pectoris: Secondary | ICD-10-CM | POA: Diagnosis not present

## 2024-05-05 DIAGNOSIS — I10 Essential (primary) hypertension: Secondary | ICD-10-CM | POA: Diagnosis not present

## 2024-05-16 ENCOUNTER — Telehealth: Payer: Self-pay

## 2024-05-16 NOTE — Transitions of Care (Post Inpatient/ED Visit) (Signed)
° °  05/16/2024  Name: Leslie Roman MRN: [id number] DOB: 11-26-1960  Today's TOC FU Call Status: Today's TOC FU Call Status:: Unsuccessful Call (1st Attempt) Unsuccessful Call (1st Attempt) Date: 05/16/24  Attempted to reach the patient regarding the most recent Inpatient/ED visit.  Follow Up Plan: Additional outreach attempts will be made to reach the patient to complete the Transitions of Care (Post Inpatient/ED visit) call.   Bing Edison MSN, RN RN [employer]  [facility] Office Hours M-F [phone] Direct Dial: [phone] Main Phone [phone]  Fax: [phone] [email or web address]

## 2024-05-17 ENCOUNTER — Telehealth: Payer: Self-pay

## 2024-05-17 NOTE — Transitions of Care (Post Inpatient/ED Visit) (Addendum)
 Today's TOC FU Call Status: Today's TOC FU Call Status:: Successful TOC FU Call Completed TOC FU Call Complete Date: 05/17/24  Patient's Name and Date of Birth confirmed. Name, DOB  Transition Care Management Follow-up Telephone Call Date of Discharge: 05/13/24 Discharge Facility: Other ([facility]) Name of Other ([facility]) Discharge Facility: [facility] Type of Discharge: Inpatient Admission Primary Inpatient Discharge Diagnosis:: NSTEMI How have you been since you were released from the hospital?: Better Any questions or concerns?: Yes Patient Questions/Concerns:: Medication clarification patient has already contacted cardiology office regarding. Reviewed with patient Patient Questions/Concerns Addressed: Other: (Medication clarification patient has already contacted cardiology office regarding. Reviewed with patient)  Items Reviewed: Did you receive and understand the discharge instructions provided?: Yes Medications obtained,verified, and reconciled?: Yes (Medications Reviewed) Any new allergies since your discharge?: No Dietary orders reviewed?: Yes Type of Diet Ordered:: To discuss lifestyle and diet modificaiton with PCP per DC Summary, Reviewed possible modifications regarding A1C of 6.0 noted in chart. Education provided on what A1C measures. Do you have support at home?: Yes People in Home [RPT]: spouse, child(ren), adult Name of Support/Comfort Primary Source: shad[place], lopez  [phone] [facility] Phone)  Medications Reviewed Today: Medications Reviewed Today     Reviewed by Carolee Heron NOVAK, RN (Case Manager) on 05/17/24 at 1524  Med List Status: <None>   Medication Order Taking? Sig Documenting Provider Last Dose Status Informant  amLODipine (NORVASC) 5 MG tablet [id number] Yes Take 5 mg by mouth daily. [provider]  Active   aspirin 81 MG chewable tablet [id number] Yes Chew 81 mg by mouth daily. Daily with meals [provider]  Active    atorvastatin  (LIPITOR) 40 MG tablet [id number] Yes Take 40 mg by mouth daily. [provider]  Active Self  Cholecalciferol (VITAMIN D3) 50 MCG (2000 UT) TABS [id number] Yes Take 2,000 Units by mouth daily. [provider]  Active Self  clopidogrel (PLAVIX) 75 MG tablet [id number] Yes Take 75 mg by mouth daily. [provider]  Active   diclofenac  Sodium (VOLTAREN ) 1 % GEL [id number]  Apply 1 application topically 4 (four) times daily as needed (knee pain). [provider]  Active Self  EPINEPHrine  (EPIPEN  2-PAK) 0.3 mg/0.3 mL IJ SOAJ injection [id number] Yes Inject 0.3 mg into the muscle as needed for anaphylaxis. [provider]  Active   flecainide (TAMBOCOR) 50 MG tablet [id number] Yes Take 50 mg by mouth 2 (two) times daily. [provider]  Active   gabapentin  (NEURONTIN ) 600 MG tablet [id number]  Take 600 mg by mouth in the morning and at bedtime. [provider]  Active Self  ibandronate (BONIVA) 150 MG tablet [id number] Yes Take 150 mg by mouth every 30 (thirty) days. Take in the morning with a full glass of water, on an empty stomach, and do not take anything else by mouth or lie down for the next 30 min. [provider]  Active   isosorbide mononitrate (IMDUR) 30 MG 24 hr tablet [id number] Yes Take 30 mg by mouth daily. Daily at 0600 [provider]  Active   lisinopril  (ZESTRIL ) 40 MG tablet [id number] Yes Take 40 mg by mouth 2 (two) times daily. Needs clarification: DC Summary of 05/13/24 indicates new as discontinued, patient reached out to cardiology pending an answer but it taking. [provider]  Active Self  metoprolol  succinate (TOPROL -XL) 25 MG 24 hr tablet [id number] Yes Take 25 mg by mouth daily.  Patient taking differently: Take 50 mg by mouth daily.  [provider]  Active Self  nitroGLYCERIN (NITROSTAT) 0.4 MG SL tablet [id number] Yes Place 0.4 mg under the tongue every 5 (five) minutes  as needed for chest pain (Ordered SL PRN for 7 days). [provider]  Active   oxyCODONE  (OXY IR/ROXICODONE ) 5 MG immediate release tablet [id number]  Take 1 tablet (5 mg total) by mouth every 4 (four) hours as needed for moderate pain ((score 4 to 6)).  Patient not taking: Reported on 05/17/2024   Cheryle Debby LABOR, MD  Active   pantoprazole  (PROTONIX ) 40 MG tablet [id number]  Take 1 tablet (40 mg total) by mouth daily.  Patient not taking: Reported on 05/17/2024   Ever, Amy S, PA-C  Active Self  promethazine  (PHENERGAN ) 25 MG tablet [id number]  Take 25 mg by mouth every 12 (twelve) hours as needed for nausea or vomiting.  [provider]  Active Self  Rimegepant Sulfate 75 MG TBDP [id number] Yes Take 75 mg by mouth every other day. [provider]  Active   sertraline  (ZOLOFT ) 100 MG tablet [id number]  Take 100 mg by mouth daily. [provider]  Active Self  spironolactone  (ALDACTONE ) 25 MG tablet [id number] Yes Take 25 mg by mouth daily.  [provider]  Active Self            Home Care and Equipment/Supplies: Were Home Health Services Ordered?: No Any new equipment or medical supplies ordered?: No  Functional Questionnaire: Do you need assistance with bathing/showering or dressing?: No Do you need assistance with meal preparation?: No Do you need assistance with eating?: No Do you have difficulty maintaining continence: No Do you need assistance with getting out of bed/getting out of a chair/moving?: No Do you have difficulty managing or taking your medications?: No  Follow up appointments reviewed: PCP Follow-up appointment confirmed?: Yes Date of PCP follow-up appointment?: 05/29/24 Follow-up Provider: [facility] Follow-up appointment confirmed?: Yes Date of Specialist follow-up appointment?: 05/22/24 Follow-Up Specialty Provider:: Cardiologist [facility] Do you need transportation to your follow-up  appointment?: No Do you understand care options if your condition(s) worsen?: Yes-patient verbalized understanding  SDOH Interventions Today    Flowsheet Row Most Recent Value  SDOH Interventions   Food Insecurity Interventions Intervention Not Indicated  Housing Interventions Intervention Not Indicated  Transportation Interventions Intervention Not Indicated, Patient Resources (Friends/Family)  Utilities Interventions Intervention Not Indicated  Health Literacy Interventions Intervention Not Indicated   05/17/24: Completed post discharge [facility] RN CM outreach with patient by telephone.  30 day follow up call program offered but declined by patient.  Reviewed DC Summary/instructions, medications, A1C, allergies, ADL/functional status, support system, depression screening, what to call provider for.  Patient stated she had benefit/payor contact information if needed.    Bing Edison MSN, RN RN [employer]  [facility]Population [facility] Office Hours M-F [phone] Direct Dial: [phone] Main Phone [phone]  Fax: [phone] [email or web address]
